# Patient Record
Sex: Female | Born: 1955 | Race: White | Hispanic: Yes | Marital: Married | Smoking: Former smoker
Health system: Southern US, Community
[De-identification: ages and names within clinical notes are randomized; demographics above are authoritative.]

## PROBLEM LIST (undated history)

## (undated) DIAGNOSIS — I10 Essential (primary) hypertension: Secondary | ICD-10-CM

## (undated) DIAGNOSIS — K219 Gastro-esophageal reflux disease without esophagitis: Secondary | ICD-10-CM

## (undated) HISTORY — DX: Essential (primary) hypertension: I10

## (undated) HISTORY — DX: Gastro-esophageal reflux disease without esophagitis: K21.9

---

## 2008-03-14 ENCOUNTER — Encounter: Admission: RE | Admit: 2008-03-14 | Discharge: 2008-03-14 | Payer: Self-pay | Admitting: Family Medicine

## 2008-05-02 ENCOUNTER — Encounter: Payer: Self-pay | Admitting: Physician Assistant

## 2008-05-10 ENCOUNTER — Encounter: Admission: RE | Admit: 2008-05-10 | Discharge: 2008-05-10 | Payer: Self-pay | Admitting: Family Medicine

## 2008-05-23 ENCOUNTER — Encounter: Payer: Self-pay | Admitting: Physician Assistant

## 2008-05-25 ENCOUNTER — Encounter: Admission: RE | Admit: 2008-05-25 | Discharge: 2008-05-25 | Payer: Self-pay | Admitting: Family Medicine

## 2008-06-02 ENCOUNTER — Encounter: Payer: Self-pay | Admitting: Physician Assistant

## 2008-07-18 ENCOUNTER — Encounter: Admission: RE | Admit: 2008-07-18 | Discharge: 2008-07-18 | Payer: Self-pay | Admitting: Pulmonary Disease

## 2008-07-26 ENCOUNTER — Encounter: Admission: RE | Admit: 2008-07-26 | Discharge: 2008-09-11 | Payer: Self-pay | Admitting: Sports Medicine

## 2008-09-05 ENCOUNTER — Observation Stay (HOSPITAL_COMMUNITY): Admission: EM | Admit: 2008-09-05 | Discharge: 2008-09-06 | Payer: Self-pay | Admitting: Emergency Medicine

## 2008-09-05 ENCOUNTER — Ambulatory Visit: Payer: Self-pay | Admitting: Cardiology

## 2008-09-06 ENCOUNTER — Encounter (INDEPENDENT_AMBULATORY_CARE_PROVIDER_SITE_OTHER): Payer: Self-pay | Admitting: Internal Medicine

## 2008-11-01 ENCOUNTER — Emergency Department (HOSPITAL_COMMUNITY): Admission: EM | Admit: 2008-11-01 | Discharge: 2008-11-01 | Payer: Self-pay | Admitting: Family Medicine

## 2009-01-26 ENCOUNTER — Emergency Department (HOSPITAL_COMMUNITY): Admission: EM | Admit: 2009-01-26 | Discharge: 2009-01-26 | Payer: Self-pay | Admitting: Emergency Medicine

## 2009-04-03 ENCOUNTER — Encounter: Payer: Self-pay | Admitting: Physician Assistant

## 2009-04-03 ENCOUNTER — Ambulatory Visit: Payer: Self-pay | Admitting: Physician Assistant

## 2009-04-03 DIAGNOSIS — M722 Plantar fascial fibromatosis: Secondary | ICD-10-CM

## 2009-04-03 DIAGNOSIS — M25559 Pain in unspecified hip: Secondary | ICD-10-CM | POA: Insufficient documentation

## 2009-04-03 DIAGNOSIS — M899 Disorder of bone, unspecified: Secondary | ICD-10-CM | POA: Insufficient documentation

## 2009-04-03 DIAGNOSIS — M1990 Unspecified osteoarthritis, unspecified site: Secondary | ICD-10-CM | POA: Insufficient documentation

## 2009-04-03 DIAGNOSIS — R82998 Other abnormal findings in urine: Secondary | ICD-10-CM

## 2009-04-03 DIAGNOSIS — E785 Hyperlipidemia, unspecified: Secondary | ICD-10-CM | POA: Insufficient documentation

## 2009-04-03 DIAGNOSIS — M949 Disorder of cartilage, unspecified: Secondary | ICD-10-CM

## 2009-04-03 LAB — CONVERTED CEMR LAB
Glucose, Urine, Semiquant: NEGATIVE
Ketones, urine, test strip: NEGATIVE
Urobilinogen, UA: 0.2

## 2009-04-04 ENCOUNTER — Encounter: Payer: Self-pay | Admitting: Physician Assistant

## 2009-04-24 ENCOUNTER — Telehealth (INDEPENDENT_AMBULATORY_CARE_PROVIDER_SITE_OTHER): Payer: Self-pay | Admitting: *Deleted

## 2009-04-24 ENCOUNTER — Encounter: Payer: Self-pay | Admitting: Physician Assistant

## 2009-05-01 IMAGING — MG MM DIGITAL DIAGNOSTIC LIMITED*R*
2 series · 2 of 2 positions shown · non-contrast
Comparison: 05/10/2008 screening mammogram.

CLINICAL DATA: 52-year-old with abnormal screening mammogram with
possible right breast mass.

[REDACTED] RIGHT  MAMMOGRAM   AND RIGHT BREAST
ULTRASOUND:

[R MLO]
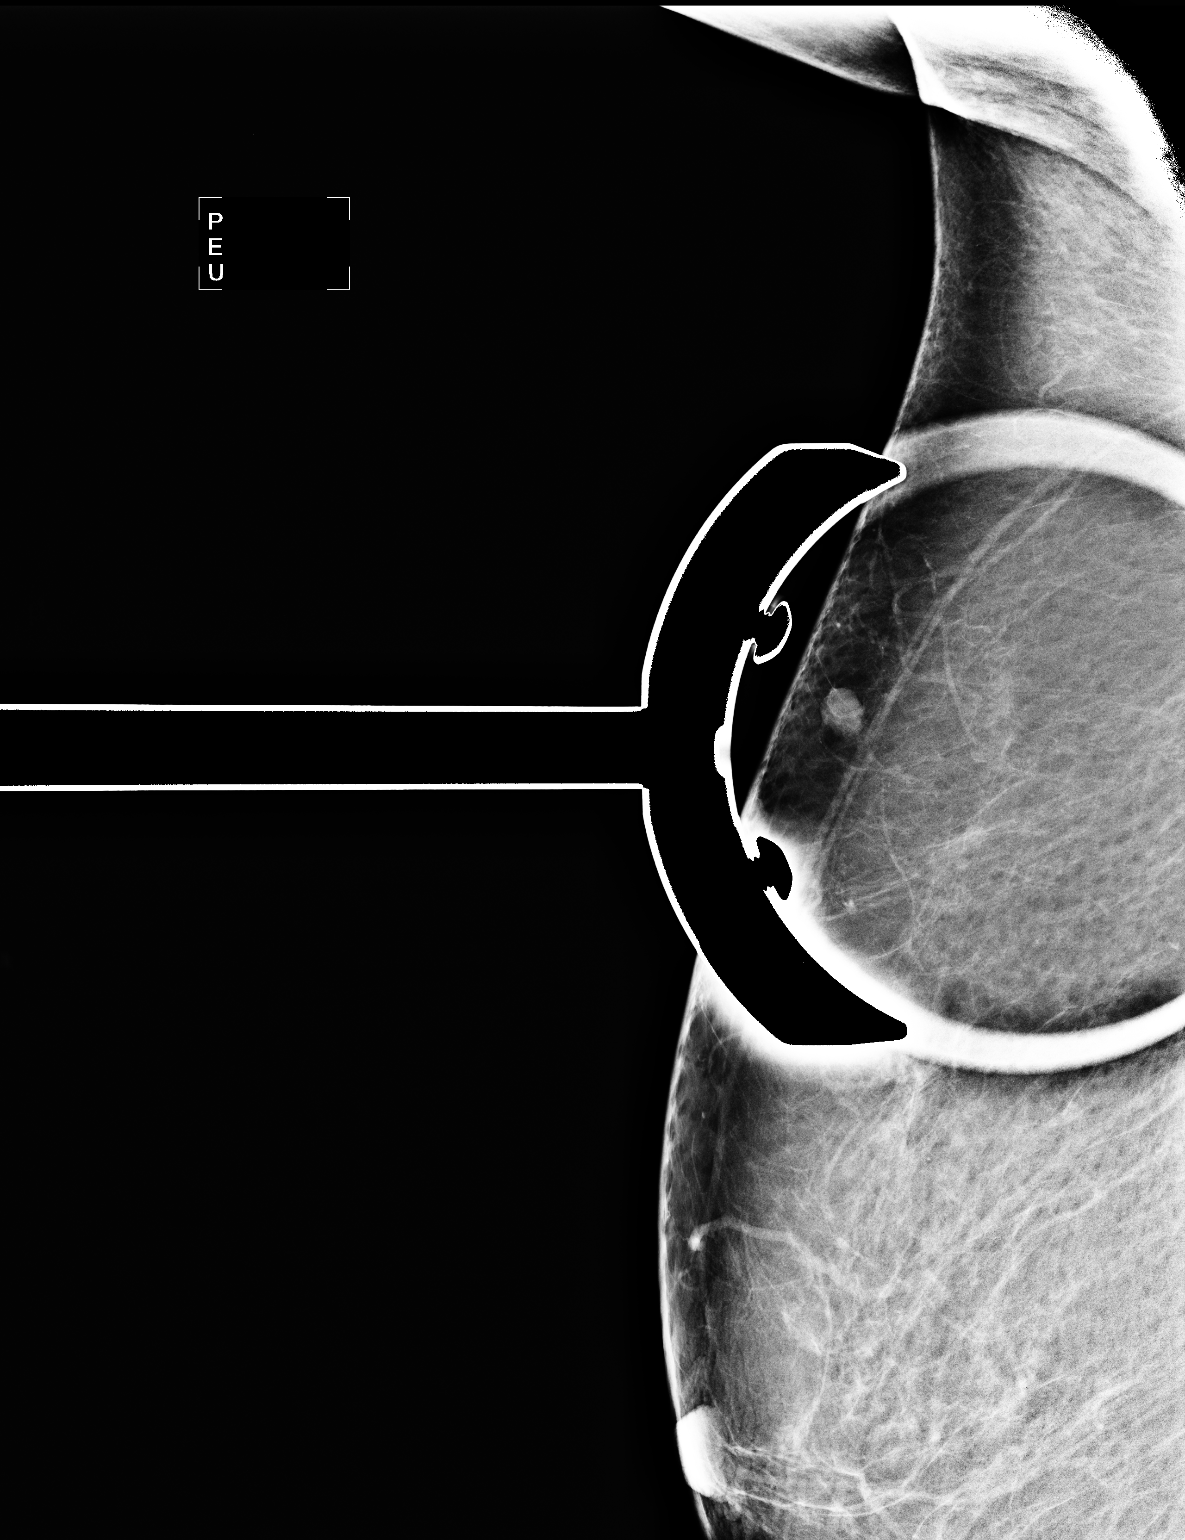

[R CC]
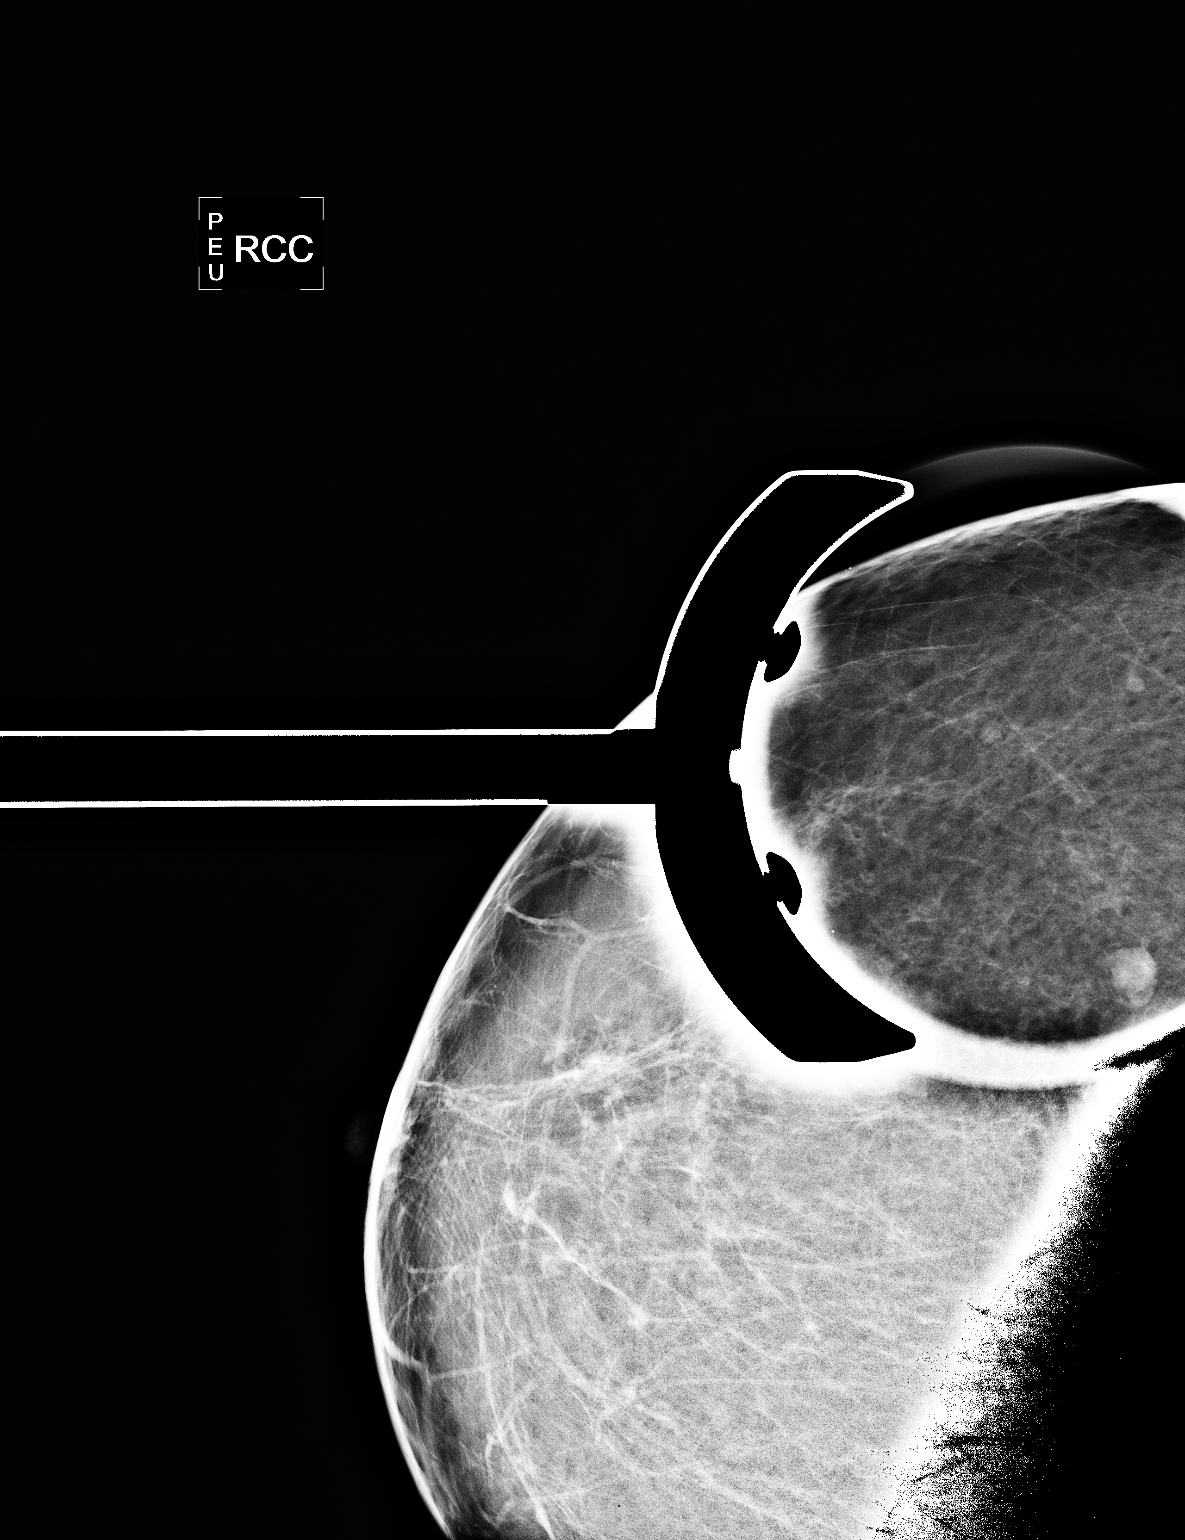

[2 of 2 positions shown; findings below may reference images not displayed]

FINDINGS: CC and MLO compression views of the upper and outer right
breast demonstrate a well circumscribed oval nodule with suggestion
of a peripheral fatty hilum.

On physical exam, no palpable abnormalities are identified within
the upper outer right breast.

Ultrasound is performed, showing a 5 x 4 x 6 mm well circumscribed
oval hypoechoic nodule in the 11 o'clock position of the right
breast 7 cm from the nipple with a peripheral fatty hilum
compatible with a benign lymph node.
IMPRESSION: Benign lymph node within the upper outer right breast corresponding
to screening mammogram abnormality.

These findings were discussed with the patient.  She was encouraged
to continue monthly self exams and to contact the [REDACTED] or
primary physician if any changes noted.

BI-RADS CATEGORY 2:  Benign finding(s).

Recommend bilateral screening mammogram in 1 year.

## 2009-05-30 ENCOUNTER — Ambulatory Visit: Payer: Self-pay | Admitting: Physician Assistant

## 2009-05-31 ENCOUNTER — Encounter: Payer: Self-pay | Admitting: Physician Assistant

## 2009-06-01 ENCOUNTER — Encounter: Admission: RE | Admit: 2009-06-01 | Discharge: 2009-06-01 | Payer: Self-pay | Admitting: Internal Medicine

## 2009-06-04 ENCOUNTER — Encounter: Payer: Self-pay | Admitting: Physician Assistant

## 2009-06-04 ENCOUNTER — Ambulatory Visit: Payer: Self-pay | Admitting: Physician Assistant

## 2009-06-04 DIAGNOSIS — R319 Hematuria, unspecified: Secondary | ICD-10-CM

## 2009-06-04 DIAGNOSIS — R079 Chest pain, unspecified: Secondary | ICD-10-CM

## 2009-06-04 DIAGNOSIS — R030 Elevated blood-pressure reading, without diagnosis of hypertension: Secondary | ICD-10-CM | POA: Insufficient documentation

## 2009-06-04 LAB — CONVERTED CEMR LAB
Bilirubin Urine: NEGATIVE
Glucose, Urine, Semiquant: NEGATIVE
Ketones, urine, test strip: NEGATIVE
Nitrite: NEGATIVE
Specific Gravity, Urine: 1.02
Urobilinogen, UA: 0.2
pH: 6

## 2009-06-05 ENCOUNTER — Encounter: Payer: Self-pay | Admitting: Physician Assistant

## 2009-06-05 LAB — CONVERTED CEMR LAB
Casts: NONE SEEN /lpf
Squamous Epithelial / LPF: NONE SEEN /lpf

## 2009-06-12 ENCOUNTER — Ambulatory Visit: Payer: Self-pay | Admitting: Physician Assistant

## 2009-06-13 DIAGNOSIS — E559 Vitamin D deficiency, unspecified: Secondary | ICD-10-CM | POA: Insufficient documentation

## 2009-06-13 LAB — CONVERTED CEMR LAB
ALT: 14 units/L (ref 0–35)
AST: 12 units/L (ref 0–37)
Albumin: 4.2 g/dL (ref 3.5–5.2)
Basophils Absolute: 0 10*3/uL (ref 0.0–0.1)
Basophils Relative: 0 % (ref 0–1)
Chloride: 104 meq/L (ref 96–112)
Creatinine, Ser: 0.67 mg/dL (ref 0.40–1.20)
Glucose, Bld: 128 mg/dL — ABNORMAL HIGH (ref 70–99)
HCT: 42.1 % (ref 36.0–46.0)
Hemoglobin: 13.3 g/dL (ref 12.0–15.0)
Lymphocytes Relative: 46 % (ref 12–46)
Lymphs Abs: 3 10*3/uL (ref 0.7–4.0)
Monocytes Absolute: 0.5 10*3/uL (ref 0.1–1.0)
Neutro Abs: 2.9 10*3/uL (ref 1.7–7.7)
Neutrophils Relative %: 45 % (ref 43–77)
Platelets: 265 10*3/uL (ref 150–400)
RBC: 4.63 M/uL (ref 3.87–5.11)
TSH: 4.212 microintl units/mL (ref 0.350–4.500)
Total Protein: 6.6 g/dL (ref 6.0–8.3)
Vit D, 25-Hydroxy: 22 ng/mL — ABNORMAL LOW (ref 30–89)

## 2009-06-14 ENCOUNTER — Encounter: Payer: Self-pay | Admitting: Physician Assistant

## 2009-06-18 ENCOUNTER — Ambulatory Visit: Payer: Self-pay | Admitting: Physician Assistant

## 2009-06-19 DIAGNOSIS — E739 Lactose intolerance, unspecified: Secondary | ICD-10-CM

## 2009-06-19 LAB — CONVERTED CEMR LAB
HDL: 54 mg/dL (ref >39)
VLDL: 53 mg/dL — ABNORMAL HIGH (ref 0–40)

## 2009-06-21 ENCOUNTER — Encounter: Payer: Self-pay | Admitting: Physician Assistant

## 2009-06-21 LAB — CONVERTED CEMR LAB
Cholesterol: 214 mg/dL — ABNORMAL HIGH (ref 0–200)
HDL: 58 mg/dL (ref >39)

## 2009-06-22 ENCOUNTER — Encounter: Payer: Self-pay | Admitting: Physician Assistant

## 2009-07-05 ENCOUNTER — Encounter: Payer: Self-pay | Admitting: Physician Assistant

## 2009-07-09 ENCOUNTER — Encounter: Payer: Self-pay | Admitting: Physician Assistant

## 2009-07-10 ENCOUNTER — Ambulatory Visit: Payer: Self-pay | Admitting: Physician Assistant

## 2009-07-10 LAB — CONVERTED CEMR LAB
Bilirubin Urine: NEGATIVE
Glucose, Urine, Semiquant: NEGATIVE
Nitrite: NEGATIVE
Pap Smear: NEGATIVE
Protein, U semiquant: NEGATIVE
Urobilinogen, UA: 0.2
WBC Urine, dipstick: NEGATIVE
pH: 6

## 2009-07-11 ENCOUNTER — Ambulatory Visit: Payer: Self-pay | Admitting: Internal Medicine

## 2009-07-11 ENCOUNTER — Encounter: Payer: Self-pay | Admitting: Physician Assistant

## 2009-07-12 ENCOUNTER — Ambulatory Visit (HOSPITAL_COMMUNITY): Admission: RE | Admit: 2009-07-12 | Discharge: 2009-07-12 | Payer: Self-pay | Admitting: Internal Medicine

## 2009-07-12 ENCOUNTER — Encounter: Payer: Self-pay | Admitting: Physician Assistant

## 2009-07-12 LAB — CONVERTED CEMR LAB
Bacteria, UA: NONE SEEN
Chlamydia, DNA Probe: NEGATIVE
RBC / HPF: NONE SEEN (ref <3)
WBC, UA: NONE SEEN cells/hpf (ref <3)

## 2009-07-24 ENCOUNTER — Ambulatory Visit: Payer: Self-pay | Admitting: Physician Assistant

## 2009-07-31 ENCOUNTER — Ambulatory Visit: Payer: Self-pay | Admitting: Physician Assistant

## 2009-08-11 ENCOUNTER — Encounter: Payer: Self-pay | Admitting: Physician Assistant

## 2009-08-16 ENCOUNTER — Encounter: Payer: Self-pay | Admitting: Physician Assistant

## 2009-08-19 ENCOUNTER — Encounter: Payer: Self-pay | Admitting: Physician Assistant

## 2009-09-07 ENCOUNTER — Telehealth: Payer: Self-pay | Admitting: Physician Assistant

## 2009-09-07 ENCOUNTER — Encounter: Payer: Self-pay | Admitting: Physician Assistant

## 2009-09-07 DIAGNOSIS — H40009 Preglaucoma, unspecified, unspecified eye: Secondary | ICD-10-CM | POA: Insufficient documentation

## 2009-09-17 ENCOUNTER — Emergency Department (HOSPITAL_COMMUNITY): Admission: EM | Admit: 2009-09-17 | Discharge: 2009-09-17 | Payer: Self-pay | Admitting: Emergency Medicine

## 2009-09-24 ENCOUNTER — Encounter: Payer: Self-pay | Admitting: Physician Assistant

## 2009-09-28 ENCOUNTER — Encounter: Payer: Self-pay | Admitting: Physician Assistant

## 2009-10-12 ENCOUNTER — Ambulatory Visit: Payer: Self-pay | Admitting: Physician Assistant

## 2009-10-12 ENCOUNTER — Telehealth: Payer: Self-pay | Admitting: Physician Assistant

## 2009-10-12 DIAGNOSIS — K047 Periapical abscess without sinus: Secondary | ICD-10-CM

## 2009-10-12 DIAGNOSIS — M25519 Pain in unspecified shoulder: Secondary | ICD-10-CM

## 2009-10-12 DIAGNOSIS — R300 Dysuria: Secondary | ICD-10-CM | POA: Insufficient documentation

## 2009-10-12 LAB — CONVERTED CEMR LAB
Bilirubin Urine: NEGATIVE
Glucose, Urine, Semiquant: NEGATIVE
Hgb A1c MFr Bld: 6.2 %

## 2009-10-13 ENCOUNTER — Encounter: Payer: Self-pay | Admitting: Physician Assistant

## 2009-10-15 LAB — CONVERTED CEMR LAB
ALT: 21 units/L (ref 0–35)
Albumin: 4.6 g/dL (ref 3.5–5.2)
Alkaline Phosphatase: 51 units/L (ref 39–117)
CO2: 27 meq/L (ref 19–32)
Cholesterol: 227 mg/dL — ABNORMAL HIGH (ref 0–200)
Creatinine, Ser: 0.64 mg/dL (ref 0.40–1.20)
Potassium: 5.1 meq/L (ref 3.5–5.3)
Sodium: 142 meq/L (ref 135–145)
Total Bilirubin: 0.3 mg/dL (ref 0.3–1.2)
Total Protein: 7.3 g/dL (ref 6.0–8.3)
Triglycerides: 185 mg/dL — ABNORMAL HIGH (ref <150)

## 2009-10-16 DIAGNOSIS — N3000 Acute cystitis without hematuria: Secondary | ICD-10-CM | POA: Insufficient documentation

## 2009-10-17 ENCOUNTER — Ambulatory Visit (HOSPITAL_COMMUNITY): Admission: RE | Admit: 2009-10-17 | Discharge: 2009-10-17 | Payer: Self-pay | Admitting: Physician Assistant

## 2009-10-23 ENCOUNTER — Telehealth: Payer: Self-pay | Admitting: Physician Assistant

## 2009-11-23 ENCOUNTER — Ambulatory Visit: Payer: Self-pay | Admitting: Physician Assistant

## 2009-11-23 DIAGNOSIS — G4700 Insomnia, unspecified: Secondary | ICD-10-CM

## 2009-11-23 LAB — CONVERTED CEMR LAB: Blood Glucose, Fingerstick: 98

## 2009-11-30 ENCOUNTER — Encounter: Payer: Self-pay | Admitting: Physician Assistant

## 2009-12-31 ENCOUNTER — Encounter: Payer: Self-pay | Admitting: Physician Assistant

## 2010-02-07 ENCOUNTER — Ambulatory Visit: Payer: Self-pay | Admitting: Physician Assistant

## 2010-02-10 LAB — CONVERTED CEMR LAB
ALT: 28 units/L (ref 0–35)
Alkaline Phosphatase: 45 units/L (ref 39–117)
Bilirubin, Direct: 0.1 mg/dL (ref 0.0–0.3)
HDL: 47 mg/dL (ref >39)
Indirect Bilirubin: 0.3 mg/dL (ref 0.0–0.9)
LDL Cholesterol: 103 mg/dL — ABNORMAL HIGH (ref 0–99)
Total Bilirubin: 0.4 mg/dL (ref 0.3–1.2)
Triglycerides: 182 mg/dL — ABNORMAL HIGH (ref <150)

## 2010-04-10 ENCOUNTER — Ambulatory Visit: Payer: Self-pay | Admitting: Nurse Practitioner

## 2010-06-03 ENCOUNTER — Ambulatory Visit (HOSPITAL_COMMUNITY)
Admission: RE | Admit: 2010-06-03 | Discharge: 2010-06-03 | Payer: Self-pay | Source: Home / Self Care | Attending: Internal Medicine | Admitting: Internal Medicine

## 2010-07-12 ENCOUNTER — Encounter (INDEPENDENT_AMBULATORY_CARE_PROVIDER_SITE_OTHER): Payer: Self-pay | Admitting: Internal Medicine

## 2010-07-12 ENCOUNTER — Ambulatory Visit
Admission: RE | Admit: 2010-07-12 | Discharge: 2010-07-12 | Payer: Self-pay | Source: Home / Self Care | Attending: Internal Medicine | Admitting: Internal Medicine

## 2010-07-12 DIAGNOSIS — F411 Generalized anxiety disorder: Secondary | ICD-10-CM | POA: Insufficient documentation

## 2010-07-12 DIAGNOSIS — R609 Edema, unspecified: Secondary | ICD-10-CM | POA: Insufficient documentation

## 2010-07-12 DIAGNOSIS — I10 Essential (primary) hypertension: Secondary | ICD-10-CM | POA: Insufficient documentation

## 2010-07-12 DIAGNOSIS — G609 Hereditary and idiopathic neuropathy, unspecified: Secondary | ICD-10-CM | POA: Insufficient documentation

## 2010-07-16 NOTE — Letter (Signed)
Summary: BMD TEST  BMD TEST   Imported By: Arta Bruce 09/17/2009 16:59:00  _____________________________________________________________________  External Attachment:    Type:   Image     Comment:   External Document

## 2010-07-16 NOTE — Progress Notes (Signed)
Summary: Office Visit/DEPRESSION SCREENING  Office Visit/DEPRESSION SCREENING   Imported By: Arta Bruce 07/20/2009 15:43:05  _____________________________________________________________________  External Attachment:    Type:   Image     Comment:   External Document

## 2010-07-16 NOTE — Miscellaneous (Signed)
Summary: [facility] [phone]  Clinical Lists Changes  Problems: Assessed UNSPECIFIED PREGLAUCOMA as comment only - seen by [facility] 6.2011 dx with ocular HTN yearly monitoring recommended  Observations: Added new observation of PAST MED HX: Osteoarthritis     a.  bilat. knees; bilat. shoulders; bilat. ankles; hand     b.  h/o shoulder injection in past Plantar fasciitis     a.  h/o injection to left heel Hyperlipidemia     a.  diet therapy ? h/o nephrolithiasis h/o frequent UTIs Osteopenia     a.  used to take Fosamax Vitamin D Insufficiency (level 22 05/2009) Post-Menopause GERD Echo 08/2008:  Normal LVF; trivial MR Glucose Intolerance (A1C 6.4 05/2009) glaucoma suspect   a.  seen by ophthal 6.2011 [facility]) (12/31/2009 13:20)       Past History:  Past Medical History: Osteoarthritis     a.  bilat. knees; bilat. shoulders; bilat. ankles; hand     b.  h/o shoulder injection in past Plantar fasciitis     a.  h/o injection to left heel Hyperlipidemia     a.  diet therapy ? h/o nephrolithiasis h/o frequent UTIs Osteopenia     a.  used to take Fosamax Vitamin D Insufficiency (level 22 05/2009) Post-Menopause GERD Echo 08/2008:  Normal LVF; trivial MR Glucose Intolerance (A1C 6.4 05/2009) glaucoma suspect   a.  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 Once daily 2)  Tramadol Hcl 50 Mg Tabs (Tramadol hcl) .... Take 1 tablet by mouth three times a day as needed for pain 3)  Calcium 600 Mg Tabs (Calcium) .... Take 1 tablet by mouth two times a day 4)  Omeprazole 20 Mg Cpdr (Omeprazole) .... Take 1 tablet by mouth once a day 5)  Vitamin D3 400 Unit Tabs (Cholecalciferol) .Marland Kitchen.. 1 by mouth two times a  day 6)  Pravastatin Sodium 40 Mg Tabs (Pravastatin sodium) .... Take 1 tab by mouth at bedtime for cholesterol 7)  Naproxen 500 Mg Tabs (Naproxen) .... Take 1 tablet by mouth two times a day with food as needed for pain 8)  Trazodone Hcl 50 Mg Tabs (Trazodone hcl) .... Take 1 tab by mouth at bedtime as needed for sleep

## 2010-07-16 NOTE — Letter (Signed)
Summary: REQUESTING RECORDS FORM DR.[facility]  REQUESTING RECORDS FORM DR.MANN   Imported By: Arta Bruce 09/24/2009 15:42:28  _____________________________________________________________________  External Attachment:    Type:   Image     Comment:   External Document

## 2010-07-16 NOTE — Assessment & Plan Note (Signed)
Summary: 1 MONTH FU FOR HIP PAIN//KT   Vital Signs:  Patient profile:   55 year old female Height:      61 inches Weight:      179 pounds Temp:     97.8 degrees F oral Pulse rate:   84 / minute Pulse rhythm:   regular Resp:     18 per minute BP sitting:   127 / 86  Vitals Entered By: [place] Shannon (November 23, 2009 9:30 AM) CC: one month f/u.... pt says her shoulder feels better with naproxen... pt says she cancelled PT since she has started back school but exercise at the [place]... CBG Result 98  Does patient need assistance? Functional Status Self care Ambulation Normal   Primary Care Provider:  Tereso Newcomer PA-C  CC:  one month f/u.... pt says her shoulder feels better with naproxen... pt says she cancelled PT since she has started back school but exercise at the rush....  History of Present Illness: 55 year old female returns for followup.  When I last saw her, she had multiple complaints.  I brought her back today to discuss her hip pain.  She was referred to physical therapy for her shoulder pain.  However, she canceled this appointment on her own.  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  She is been trying to seek some type of medical employment in the [place].  She just enrolled in surgical technician school.  She does not have time to go to physical therapy.  She has been taking the naproxen and this has helped her pain quite a bit.  She has been doing exercises on her own with good relief.  Her hip pain is also improved.  Her plantar fasciitis is also improved.  Of note, she's had trouble sleeping lately.  She's been studying late at night.  She denies watching TV in bed reading in bed.  She denies alcohol use or caffeine use before bedt.  She denies exercising before bed.  She denies depression.  She denies crying spells.  She denies suicidal ideations.  Problems Prior to Update: 1)  Insomnia  (ICD-780.52) 2)  Acute Cystitis  (ICD-595.0) 3)  Dysuria   (ICD-788.1) 4)  Shoulder Pain, Right  (ICD-719.41) 5)  Abscess, Tooth  (ICD-522.5) 6)  Unspecified Preglaucoma  (ICD-365.00) 7)  Routine Gynecological Examination  (ICD-V72.31) 8)  Glucose Intolerance  (ICD-271.3) 9)  Vitamin D Deficiency  (ICD-268.9) 10)  Hematuria Unspecified  (ICD-599.70) 11)  Increased Blood Pressure  (ICD-796.2) 12)  Chest Pain Unspecified  (ICD-786.50) 13)  Preventive Health Care  (ICD-V70.0) 14)  Unspecified Breast Screening  (ICD-V76.10) 15)  Urinalysis, Abnormal  (ICD-791.9) 16)  Hip Pain  (ICD-719.45) 17)  Fasciitis, Plantar  (ICD-728.71) 18)  Osteopenia  (ICD-733.90) 19)  Family History Diabetes 1st Degree Relative  (ICD-V18.0) 20)  Hyperlipidemia  (ICD-272.4) 21)  Osteoarthritis  (ICD-715.90)  Current Medications (verified): 1)  Vitamin E 200 Unit Caps (Vitamin E) .... Once Daily 2)  Tramadol Hcl 50 Mg Tabs (Tramadol Hcl) .... Take 1 Tablet By Mouth Three Times A Day As Needed For Pain 3)  Calcium 600 Mg Tabs (Calcium) .... Take 1 Tablet By Mouth Two Times A Day 4)  Omeprazole 20 Mg Cpdr (Omeprazole) .... Take 1 Tablet By Mouth Once A Day 5)  Vitamin D3 400 Unit Tabs (Cholecalciferol) .Marland Kitchen.. 1 By Mouth Two Times A Day 6)  Pravastatin Sodium 40 Mg Tabs (Pravastatin Sodium) .... Take 1 Tab By Mouth At  Bedtime For Cholesterol 7)  Naproxen 500 Mg Tabs (Naproxen) .... Take 1 Tablet By Mouth Two Times A Day With Food As Needed For Pain 8)  Ergocalciferol 50000 Unit Caps (Ergocalciferol) .... Take One By Mouth Once A Week For 8 Weeks.  Allergies (verified): No Known Drug Allergies  Past History:  Past Medical History: Last updated: 07/10/2009 Osteoarthritis     a.  bilat. knees; bilat. shoulders; bilat. ankles; hand     b.  h/o shoulder injection in past Plantar fasciitis     a.  h/o injection to left heel Hyperlipidemia     a.  diet therapy ? h/o nephrolithiasis h/o frequent UTIs Osteopenia     a.  used to take Fosamax Vitamin D Insufficiency  (level 22 05/2009) Post-Menopause GERD Echo 08/2008:  Normal LVF; trivial MR Glucose Intolerance (A1C 6.4 05/2009)  Physical Exam  General:  alert, well-developed, and well-nourished.   Head:  normocephalic and atraumatic.   Lungs:  normal breath sounds.   Heart:  normal rate and regular rhythm.   Neurologic:  alert & oriented X3 and cranial nerves II-XII intact.   Psych:  normally interactive and good eye contact.     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 has f/u next week  Problem # 3:  SHOULDER PAIN, RIGHT (ICD-719.41) improved on her own with NSAID and home exercise program  Her updated medication list for this problem includes:    Tramadol Hcl 50 Mg Tabs (Tramadol hcl) .Marland Kitchen... Take 1 tablet by mouth three times a day as needed for pain    Naproxen 500 Mg Tabs (Naproxen) .Marland Kitchen... Take 1 tablet by mouth two times a day with food as needed for pain  Problem # 4:  HIP PAIN (ICD-719.45) Assessment: Improved no further w/u  Her updated medication list for this problem includes:    Tramadol Hcl 50 Mg Tabs (Tramadol hcl) .Marland Kitchen... Take 1 tablet by mouth three times a day as needed for pain    Naproxen 500 Mg Tabs (Naproxen) .Marland Kitchen... Take 1 tablet by mouth two times a day with food as needed for pain  Problem # 5:  VITAMIN D DEFICIENCY (ICD-268.9) recheck level in Aug  Problem # 6:  HYPERLIPIDEMIA (ICD-272.4) recheck FLP in Aug  Her updated medication list for this problem includes:    Pravastatin Sodium 40 Mg Tabs (Pravastatin sodium) .Marland Kitchen... Take 1 tab by mouth at bedtime for cholesterol  Problem # 7:  DYSURIA (ICD-788.1) has appt with urology later this month  Problem # 8:  PREVENTIVE HEALTH CARE (ICD-V70.0) f/u CPP in Jan 2012  Complete Medication List: 1)  Vitamin E 200  Unit Caps (Vitamin e) .... Once daily 2)  Tramadol Hcl 50 Mg Tabs (Tramadol hcl) .... Take 1 tablet by mouth three times a day as needed for pain 3)  Calcium 600 Mg Tabs (Calcium) .... Take 1 tablet by mouth two times a day 4)  Omeprazole 20 Mg Cpdr (Omeprazole) .... Take 1 tablet by mouth once a day 5)  Vitamin D3 400 Unit Tabs (Cholecalciferol) .Marland Kitchen.. 1 by mouth two times a day 6)  Pravastatin Sodium 40 Mg Tabs (Pravastatin sodium) .... Take 1 tab by mouth at bedtime for cholesterol 7)  Naproxen 500 Mg Tabs (Naproxen) .... Take 1 tablet by mouth two times  a day with food as needed for pain 8)  Trazodone Hcl 50 Mg Tabs (Trazodone hcl) .... Take 1 tab by mouth at bedtime as needed for sleep  Patient Instructions: 1)  Schedule lab visit in 1st of August for Vitamin D level (268.9), FLP and LFTs (272.4). 2)  Try taking Benadryl (diphenhydramine) 25 mg at bedtime as needed for sleep.  You can get it over the counter. 3)  If this does not help, try the Trazodone. 4)  Arrange CPP in January 2012 with Scott.  Return sooner as needed. Prescriptions: TRAZODONE HCL 50 MG TABS (TRAZODONE HCL) Take 1 tab by mouth at bedtime as needed for sleep  #20 x 1   Entered and Authorized by:   Tereso Newcomer PA-C   Signed by:   Tereso Newcomer PA-C on 11/23/2009   Method used:   Print then Give to Patient   RxID:   [id number]

## 2010-07-16 NOTE — Letter (Signed)
Summary: PROGRESS NOTE DR.MANN  PROGRESS NOTE DR.MANN   Imported By: Arta Bruce 10/01/2009 10:23:19  _____________________________________________________________________  External Attachment:    Type:   Image     Comment:   External Document

## 2010-07-16 NOTE — Letter (Signed)
Summary: NUTRITIONIAST SUMMARY//SUSIE  NUTRITIONIAST SUMMARY//SUSIE   Imported By: Arta Bruce 10/09/2009 14:40:09  _____________________________________________________________________  External Attachment:    Type:   Image     Comment:   External Document

## 2010-07-16 NOTE — Letter (Signed)
Summary: [facility]   Imported By: Arta Bruce 01/01/2010 15:33:14  _____________________________________________________________________  External Attachment:    Type:   Image     Comment:   External Document

## 2010-07-16 NOTE — Miscellaneous (Signed)
Summary: records reviewed from prior PCP  Reviewed notes from Dr. Maryelizabeth Rowan (prior PCP). Patient had shoulder injection done once due to pain 2/2 rotator cuff tendonitis. Last pap was in 2009. Main hx includes arthritis and rotator cuff syndrome.  Clinical Lists Changes  Observations: Added new observation of PAST MED HX: Osteoarthritis     a.  bilat. knees; bilat. shoulders; bilat. ankles; hand     b.  h/o shoulder injection in past Plantar fasciitis     a.  h/o injection to left heel Hyperlipidemia     a.  diet therapy ? h/o nephrolithiasis h/o frequent UTIs Osteopenia     a.  used to take Fosamax Vitamin D Insufficiency (level 22 05/2009) Post-Menopause Mammogram 05/28/2009 per patient (not in [place]) GERD Echo 08/2008:  Normal LVF; trivial MR Glucose Intolerance (A1C 6.4 05/2009)  (07/09/2009 14:57) Added new observation of PAP SMEAR:  Specimen Adequacy: Satisfactory for evaluation.   Interpretation/Result:Negative for intraepithelial Lesion or Malignancy.   Interpretation/Result:Reactive Changes.  Interpretation/Result:Atrophy.    Performed at [facility] (Dr. Maryelizabeth Rowan)  (05/02/2008 14:59)      Pap Smear  Procedure date:  05/02/2008  Findings:       Specimen Adequacy: Satisfactory for evaluation.   Interpretation/Result:Negative for intraepithelial Lesion or Malignancy.   Interpretation/Result:Reactive Changes.  Interpretation/Result:Atrophy.    Performed at [facility] (Dr. Maryelizabeth Rowan)     Past History:  Past Medical History: Osteoarthritis     a.  bilat. knees; bilat. shoulders; bilat. ankles; hand     b.  h/o shoulder injection in past Plantar fasciitis     a.  h/o injection to left heel Hyperlipidemia     a.  diet therapy ? h/o nephrolithiasis h/o frequent UTIs Osteopenia     a.  used to take Fosamax Vitamin D Insufficiency (level 22 05/2009) Post-Menopause Mammogram 05/28/2009 per patient  (not in [place]) GERD Echo 08/2008:  Normal LVF; trivial MR Glucose Intolerance (A1C 6.4 05/2009)

## 2010-07-16 NOTE — Letter (Signed)
Summary: RECORDS RECEIVED FROM [facility]   RECORDS RECEIVED FROM [facility]   Imported By: Arta Bruce 09/03/2009 11:50:07  _____________________________________________________________________  External Attachment:    Type:   Image     Comment:   External Document

## 2010-07-16 NOTE — Letter (Signed)
Summary: TEST ORDER FORM//MAMMOGRAM//APPT DATE & TIME  TEST ORDER FORM//MAMMOGRAM//APPT DATE & TIME   Imported By: Arta Bruce 07/05/2009 15:06:38  _____________________________________________________________________  External Attachment:    Type:   Image     Comment:   External Document

## 2010-07-16 NOTE — Letter (Signed)
Summary: *HSN Results Follow up  [facility]  [address] [place], [place] [address]   Phone: [phone]  Fax: [phone]      07/12/2009   [place] Esparza [address] [place], [place]  [address]   Dear  Ms. Anna Esparza,                            ____S.Drinkard,FNP   ____D. Gore,FNP       ____B. McPherson,MD   ____V. Rankins,MD    ____E. Mulberry,MD    ____N. Daphine Deutscher, FNP  ____D. Reche Dixon, MD    ____K. Philipp Deputy, MD    __x__S. Alben Spittle, PA-C     This letter is to inform you that your recent test(s):  ___x____Pap Smear    _______Lab Test     _______X-ray    ___x____ is within acceptable limits  _______ requires a medication change  _______ requires a follow-up lab visit  _______ requires a follow-up visit with your provider   Comments:       _________________________________________________________ If you have any questions, please contact our office                     Sincerely,  Anna Newcomer PA-C [facility]

## 2010-07-16 NOTE — Miscellaneous (Signed)
Summary: Colo 2010. . . . Repeat 2015  Clinical Lists Changes  Observations: Added new observation of COLONRECACT: Repeat colonoscopy in 5 years.  (06/21/2008 16:11) Added new observation of COLONOSCOPY:  Results: Hemorrhoids.     Location:  [facility].   Results: Normal.  (06/21/2008 16:11)      Colonoscopy  Procedure date:  06/21/2008  Findings:       Results: Hemorrhoids.     Location:  [facility].   Results: Normal.   Comments:      Repeat colonoscopy in 5 years.

## 2010-07-16 NOTE — Letter (Signed)
Summary: *HSN Results Follow up  [facility]  [address] [place], [place] [address]   Phone: [phone]  Fax: [phone]      06/22/2009   [name] [address] [place], [place]  [address]   Dear  Ms. Anna Esparza,                            ____S.Drinkard,FNP   ____D. Gore,FNP       ____B. McPherson,MD   ____V. Rankins,MD    ____E. Mulberry,MD    ____N. Daphine Deutscher, FNP  ____D. Reche Dixon, MD    ____K. Philipp Deputy, MD    __x__S. Alben Spittle, PA-C     This letter is to inform you that your recent test(s):  _______Pap Smear    ___x____Lab Test     _______X-ray    ___x____ is within acceptable limits  _______ requires a medication change  _______ requires a follow-up lab visit  _______ requires a follow-up visit with your provider   Comments:  Your stool test was negative for blood.       _________________________________________________________ If you have any questions, please contact our office                     Sincerely,  Anna Newcomer PA-C [facility]

## 2010-07-16 NOTE — Progress Notes (Signed)
Summary: Office Visit//DEPRESSION SCREENING  Office Visit//DEPRESSION SCREENING   Imported By: Arta Bruce 09/05/2009 10:14:48  _____________________________________________________________________  External Attachment:    Type:   Image     Comment:   External Document

## 2010-07-16 NOTE — Assessment & Plan Note (Signed)
Summary: FLU SHOT////KT  Nurse Visit   Allergies: No Known Drug Allergies  Immunizations Administered:  Influenza Vaccine # 1:    Vaccine Type: Fluvax 3+    Site: right deltoid    Mfr: GlaxoSmithKline    Dose: 0.5 ml    Route: IM    Given by: Gaylyn Cheers RN    Exp. Date: 12/14/2010    Lot #: [id number]    VIS given: 01/08/10 version given April 10, 2010.  Flu Vaccine Consent Questions:    Do you have a history of severe allergic reactions to this vaccine? no    Any prior history of allergic reactions to egg and/or gelatin? no    Do you have a sensitivity to the preservative Thimersol? no    Do you have a past history of Guillan-Barre Syndrome? no    Do you currently have an acute febrile illness? no    Have you ever had a severe reaction to latex? no    Vaccine information given and explained to patient? yes    Are you currently pregnant? no  Orders Added: 1)  Flu Vaccine 50yrs + [90658] 2)  Admin 1st Vaccine [90471] 3)  Est. Patient Nurse visit [09003]

## 2010-07-16 NOTE — Miscellaneous (Signed)
Summary: Colo Normal 2005; repeat due 2015  Clinical Lists Changes  Observations: Added new observation of FAMILY HX: Family History Diabetes 1st degree relative - Mom colon cancer - uncle   (08/19/2009 16:06) Added new observation of COLONRECACT: Repeat colonoscopy in 5 years.  (06/21/2008 16:12) Added new observation of COLONOSCOPY:  Results: Normal. Results: Hemorrhoids.      Performed by Dr. Loreta Ave at [facility] (06/21/2008 16:12)        Colonoscopy  Procedure date:  06/21/2008  Findings:       Results: Normal. Results: Hemorrhoids.      Performed by Dr. Loreta Ave at [facility]  Comments:      Repeat colonoscopy in 5 years.    Family History: Family History Diabetes 1st degree relative - Mom colon cancer - uncle  Appended Document: Colo Normal 2005; repeat due 2015    Clinical Lists Changes  Observations: Added new observation of COLONNXTDUE: 06/2013 (08/19/2009 16:14)        Preventive Care Screening  Colonoscopy:    Next Due:  06/2013

## 2010-07-16 NOTE — Progress Notes (Signed)
  Phone Note Outgoing Call   Summary of Call: Need ophthalmology referral for glaucoma suspect on retasure. Initial call taken by: Brynda Rim,  September 07, 2009 4:19 PM

## 2010-07-16 NOTE — Progress Notes (Signed)
Summary: PT referral  Phone Note Outgoing Call   Summary of Call: Please refer to PT for right shoulder pain. Initial call taken by: Brynda Rim,  Oct 23, 2009 5:16 PM

## 2010-07-16 NOTE — Letter (Signed)
Summary: RETASURE  RETASURE   Imported By: Arta Bruce 11/19/2009 10:25:42  _____________________________________________________________________  External Attachment:    Type:   Image     Comment:   External Document

## 2010-07-16 NOTE — Letter (Signed)
Summary: NUTRITIONIST//SUSIE  NUTRITIONIST//SUSIE   Imported By: Arta Bruce 09/27/2009 15:13:37  _____________________________________________________________________  External Attachment:    Type:   Image     Comment:   External Document

## 2010-07-16 NOTE — Letter (Signed)
Summary: REQUESTING RECORDSS FROM DR.ELIZABETH DEWEY  REQUESTING RECORDSS FROM DR.ELIZABETH DEWEY   Imported By: Arta Bruce 08/13/2009 15:58:12  _____________________________________________________________________  External Attachment:    Type:   Image     Comment:   External Document

## 2010-07-16 NOTE — Letter (Signed)
Summary: COLO REPORT//DUE 2015  COLO REPORT//DUE 2015   Imported By: Arta Bruce 10/19/2009 14:44:00  _____________________________________________________________________  External Attachment:    Type:   Image     Comment:   External Document

## 2010-07-16 NOTE — Assessment & Plan Note (Signed)
Summary: CPP////KT   Vital Signs:  Patient profile:   55 year old female Height:      61 inches Weight:      180 pounds BMI:     34.13 Temp:     97.5 degrees F oral Pulse rate:   74 / minute Pulse rhythm:   regular Resp:     18 per minute BP sitting:   116 / 74  (left arm) Cuff size:   large  Vitals Entered By: [place] Shannon (July 10, 2009 9:16 AM) CC: cpp Is Patient Diabetic? No Pain Assessment Patient in pain? no       Does patient need assistance? Functional Status Self care Ambulation Normal   CC:  cpp.  History of Present Illness: Here for CPP.  Health maint: Last pap in 2009. No h/o abnormal pap. No recent bleeding.  No odor, discharge or itching. Mammo done in 05/2009 and was ok. No FHx of Breast or Ovarian cancer. Reports having colonoscopy done in 06/2008.  Says it was normal and would need repeat in 5 years. Says Dr. Loreta Ave did colonscopy. She takes calcium with Vit. D.  Level of VIt. D was 22 in last couple of months. Had DEXA scan in 2009 per her report.  Did take Fosamax at one time.  Was told to take for one year but only took for 5 mos.    High chol:  Started her on pravastatin for high lipids.  Her husband has Crestor and she is taking that instead.    Habits & Providers  Alcohol-Tobacco-Diet     Tobacco Status: quit  Exercise-Depression-Behavior     Have you felt down or hopeless? no     Have you felt little pleasure in things? no  Problems Prior to Update: 1)  Routine Gynecological Examination  (ICD-V72.31) 2)  Glucose Intolerance  (ICD-271.3) 3)  Vitamin D Deficiency  (ICD-268.9) 4)  Hematuria Unspecified  (ICD-599.70) 5)  Increased Blood Pressure  (ICD-796.2) 6)  Chest Pain Unspecified  (ICD-786.50) 7)  Preventive Health Care  (ICD-V70.0) 8)  Unspecified Breast Screening  (ICD-V76.10) 9)  Urinalysis, Abnormal  (ICD-791.9) 10)  Hip Pain  (ICD-719.45) 11)  Fasciitis, Plantar  (ICD-728.71) 12)  Osteopenia  (ICD-733.90) 13)  Family  History Diabetes 1st Degree Relative  (ICD-V18.0) 14)  Hyperlipidemia  (ICD-272.4) 15)  Osteoarthritis  (ICD-715.90)  Current Medications (verified): 1)  Vitamin E 200 Unit Caps (Vitamin E) .... Once Daily 2)  Diclofenac Sodium 75 Mg Tbec (Diclofenac Sodium) .... Once Daily 3)  Tramadol Hcl 50 Mg Tabs (Tramadol Hcl) .... Take 1 Tablet By Mouth Three Times A Day As Needed For Pain 4)  Calcium 600 Mg Tabs (Calcium) .... Take 1 Tablet By Mouth Two Times A Day 5)  Omeprazole 20 Mg Cpdr (Omeprazole) .... Take 1 Tablet By Mouth Once A Day 6)  Vitamin D3 400 Unit Tabs (Cholecalciferol) .Marland Kitchen.. 1 By Mouth Two Times A Day 7)  Pravastatin Sodium 20 Mg Tabs (Pravastatin Sodium) .... Take 1 Tab By Mouth At Bedtime  Allergies (verified): No Known Drug Allergies  Past History:  Past Surgical History: Last updated: 04/03/2009 Denies surgical history  Past Medical History: Osteoarthritis     a.  bilat. knees; bilat. shoulders; bilat. ankles; hand     b.  h/o shoulder injection in past Plantar fasciitis     a.  h/o injection to left heel Hyperlipidemia     a.  diet therapy ? h/o nephrolithiasis h/o frequent UTIs Osteopenia  a.  used to take Fosamax Vitamin D Insufficiency (level 22 05/2009) Post-Menopause GERD Echo 08/2008:  Normal LVF; trivial MR Glucose Intolerance (A1C 6.4 05/2009)  Family History: Reviewed history from 04/03/2009 and no changes required. Family History Diabetes 1st degree relative - Mom  Social History: Reviewed history from 04/03/2009 and no changes required. 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 CV:  Denies chest pain or discomfort and shortness of breath with exertion. Resp:  Denies cough. GI:  Complains of constipation; denies bloody stools, dark tarry stools, and  diarrhea. GU:  Denies hematuria. Psych:  Denies depression. Endo:  Denies cold intolerance.  Physical Exam  General:  alert, well-developed, and well-nourished.   Head:  normocephalic and atraumatic.   Eyes:  pupils equal, pupils round, pupils reactive to light, and no retinal abnormalitiies.   Ears:  R ear normal and L ear normal.   Nose:  no external deformity.   Mouth:  pharynx pink and moist, no erythema, and no exudates.   Neck:  supple, no thyromegaly, no carotid bruits, and no cervical lymphadenopathy.   Breasts:  skin/areolae normal, no masses, no abnormal thickening, no nipple discharge, no tenderness, and no adenopathy.   Lungs:  normal breath sounds, no crackles, and no wheezes.   Heart:  normal rate, regular rhythm, and no murmur.   Abdomen:  soft, non-tender, normal bowel sounds, and no hepatomegaly.   Rectal:  normal sphincter tone, no masses, and external hemorrhoid(s).   Genitalia:  normal introitus, no external lesions, no vaginal discharge, mucosa pink and moist, no vaginal or cervical lesions, no vaginal atrophy, no friaility or hemorrhage, normal uterus size and position, and no adnexal masses or tenderness.   Msk:  normal ROM.   Pulses:  DP/PT 2+ bilat  Extremities:  no edema  Neurologic:  alert & oriented X3, cranial nerves II-XII intact, strength normal in all extremities, and DTRs symmetrical and normal.   Skin:  turgor normal.   Psych:  normally interactive and good eye contact.     Impression & Recommendations:  Problem # 1:  PREVENTIVE HEALTH CARE (ICD-V70.0) PHQ9=0 has a h/o osteopenia and had taken fosamax repeat DEXA   Orders: Dexa scan (Dexa scan)  Problem # 2:  ROUTINE GYNECOLOGICAL EXAMINATION (ICD-V72.31)  Orders: KOH/ WET Mount [phone]) T- GC Chlamydia (60454) T-Pap Smear, Thin Prep (09811)  Problem # 3:  GLUCOSE INTOLERANCE (ICD-271.3)  Hgb A1C was 6.4 recently refer to Susie Piper  Orders: Diabetic Clinic Referral  (Diabetic) T-Urine Microalbumin w/creat. ratio [phone])  Problem # 4:  HYPERLIPIDEMIA (ICD-272.4) pravastatin Rx'd but patient taking Crestor that her husband stopped taking needs repeat L/L in 3 mos.  Her updated medication list for this problem includes:    Pravastatin Sodium 20 Mg Tabs (Pravastatin sodium) .Marland Kitchen... Take 1 tab by mouth at bedtime  Problem # 5:  HEMATURIA UNSPECIFIED (ICD-599.70)  send for micro and c+s tx for UTI in Dec.  Orders: T-Culture, Urine ([id number]) T- * Misc. Laboratory test [phone])  Complete Medication List: 1)  Vitamin E 200 Unit Caps (Vitamin e) .... Once daily 2)  Diclofenac Sodium 75 Mg Tbec (Diclofenac sodium) .... Once daily 3)  Tramadol Hcl 50 Mg Tabs (Tramadol hcl) .... Take 1 tablet by mouth three times a day as needed for pain 4)  Calcium  600 Mg Tabs (Calcium) .... Take 1 tablet by mouth two times a day 5)  Omeprazole 20 Mg Cpdr (Omeprazole) .... Take 1 tablet by mouth once a day 6)  Vitamin D3 400 Unit Tabs (Cholecalciferol) .Marland Kitchen.. 1 by mouth two times a day 7)  Pravastatin Sodium 20 Mg Tabs (Pravastatin sodium) .... Take 1 tab by mouth at bedtime  Patient Instructions: 1)  Call us today and let us know what dose of Crestor you are on. 2)  Sign form to get records released from Dr. Loreta Ave.  Need colonoscopy report from 2010. 3)  Schedule follow up lab visit in April 2011 for CMET, Lipids.  Come fasting (nothing to eat or drink after midnight except water).  Dx 272.4. 4)  Schedule [facility] at [facility]. 5)  Please schedule a follow-up appointment in 3 months with Reuben Knoblock for lipids and sugar.   Laboratory Results   Urine Tests  Date/Time Received: July 10, 2009 9:30 AM   Routine Urinalysis   Glucose: negative   (Normal Range: Negative) Bilirubin: negative   (Normal Range: Negative) Ketone: negative   (Normal Range: Negative) Spec. Gravity: 1.015   (Normal Range: 1.003-1.035) Blood: trace-intact   (Normal Range:  Negative) pH: 6.0   (Normal Range: 5.0-8.0) Protein: negative   (Normal Range: Negative) Urobilinogen: 0.2   (Normal Range: 0-1) Nitrite: negative   (Normal Range: Negative) Leukocyte Esterace: negative   (Normal Range: Negative)      Wet Mount Source: vaginal WBC/hpf: 1-5 Bacteria/hpf: 1+ Clue cells/hpf: none  Negative whiff Yeast/hpf: none Wet [place] KOH: Negative Trichomonas/hpf: none  Stool - Occult Blood Hemmoccult #1: negative Date: 07/10/2009    Appended Document: CPP////KT Patient: Anna Esparza Note: All result statuses are Final unless otherwise noted.  Tests: (1) Chlamydia and GC Probe Amp, Genital (5990)  Chlamydia Probe Amp, Genital                             NEGATIVE                    NEGATIVE           Testing performed using the BD Probetec ET Chlamydia     trachomatis and Neisseria gonorrhea amplified DNA assay.  GC Probe Amp, Genital                             NEGATIVE                    NEGATIVE           Testing performed using the BD Probetec ET Chlamydia     trachomatis and Neisseria gonorrhea amplified DNA assay.  Tests: (2) Microalbumin ([address])   Microalbumin              0.59 mg/dL                  0.00-1.89  Tests: (3) Urine Microscopic (65[address])  Squamous Epithelial/ HPF                             NONE SEEN                   RARE   Crystals                  NONE SEEN  NEG   Casts                     NONE SEEN                   NEG   WBC                       NONE SEEN WBC/hpf           <3   RBC                       NONE SEEN RBC/hpf           <3   Bacteria/ HPF             NONE SEEN                   RARE  Note: An exclamation mark (!) indicates a result that was not dispersed into the flowsheet. Document Creation Date: 07/11/2009 11:35 AM _______________________________________________________________________  (1) Order result status: Final Collection or observation date-time: 07/10/2009 21:31 Requested  date-time: 07/10/2009 10:35 Receipt date-time: 07/10/2009 21:31 Reported date-time: 07/11/2009 11:35 Referring Physician:   Ordering Physician:  Alben Spittle [id number]) Specimen Source:  Source: Lajean Silvius Order Number: [id number] Lab site: [facility]     [address]     [place]  [place]  [address]  (2) Order result status: Final Collection or observation date-time: 07/10/2009 21:31 Requested date-time: 07/10/2009 10:35 Receipt date-time: 07/10/2009 21:31 Reported date-time: 07/11/2009 11:35 Referring Physician:   Ordering Physician:  Alben Spittle [id number]) Specimen Source:  Source: Lajean Silvius Order Number: [id number] Lab site: [facility]     [address]     [place]  [place]  [address]  (3) Order result status: Final Collection or observation date-time: 07/10/2009 21:31 Requested date-time: 07/10/2009 10:35 Receipt date-time: 07/10/2009 21:31 Reported date-time: 07/11/2009 11:35 Referring Physician:   Ordering Physician:  Alben Spittle [id number]) Specimen Source:  Source: Lajean Silvius Order Number: [id number] Lab site: [facility]     [address], [address]     [place]  [place]  [address]   -----------------  The following non-numeric lab results were dispersed to the flowsheet even though numeric results were expected:    Squamous Epithelial/ HPF, NONE SEEN   WBC, NONE SEEN   Signed by Tereso Newcomer PA-C on 07/12/2009 at 10:15 PM  ________________________________________________________________________ no red cells + 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

## 2010-07-16 NOTE — Letter (Signed)
Summary: referral/diabetic clinic//appt date & time  referral/diabetic clinic//appt date & time   Imported By: Arta Bruce 08/30/2009 14:18:23  _____________________________________________________________________  External Attachment:    Type:   Image     Comment:   External Document

## 2010-07-16 NOTE — Miscellaneous (Signed)
Summary: Retasure 06/2009  Clinical Lists Changes  Problems: Added new problem of UNSPECIFIED PREGLAUCOMA (ICD-365.00) Assessed UNSPECIFIED PREGLAUCOMA as comment only -  Orders: Ophthalmology Referral (Ophthalmology)  Orders: Added new Referral order of Ophthalmology Referral (Ophthalmology) - Signed Observations: Added new observation of DIAB EYE EX: Ret[facility] (glaucoma suspect) (07/16/2009 16:18)       Impression & Recommendations:  Problem # 1:  UNSPECIFIED PREGLAUCOMA (ICD-365.00)  Orders: Ophthalmology Referral (Ophthalmology)  Complete Medication List: 1)  Vitamin E 200 Unit Caps (Vitamin e) .... Once daily 2)  Diclofenac Sodium 75 Mg Tbec (Diclofenac sodium) .... Once daily 3)  Tramadol Hcl 50 Mg Tabs (Tramadol hcl) .... Take 1 tablet by mouth three times a day as needed for pain 4)  Calcium 600 Mg Tabs (Calcium) .... Take 1 tablet by mouth two times a day 5)  Omeprazole 20 Mg Cpdr (Omeprazole) .... Take 1 tablet by mouth once a day 6)  Vitamin D3 400 Unit Tabs (Cholecalciferol) .Marland Kitchen.. 1 by mouth two times a day 7)  Pravastatin Sodium 20 Mg Tabs (Pravastatin sodium) .... Take 1 tab by mouth at bedtime

## 2010-07-16 NOTE — Progress Notes (Signed)
Summary: urology referral  Phone Note Outgoing Call   Summary of Call: Needs referral to Urology for possible interstitial cystitis.  Initial call taken by: Tereso Newcomer PA-C,  October 12, 2009 10:16 AM

## 2010-07-16 NOTE — Op Note (Signed)
Summary: Operative Report  Operative Report   Imported By: Arta Bruce 10/01/2009 10:20:52  _____________________________________________________________________  External Attachment:    Type:   Image     Comment:   External Document

## 2010-07-16 NOTE — Letter (Signed)
Summary: Handout Printed  Printed Handout:  - Insomnia 

## 2010-07-16 NOTE — Progress Notes (Signed)
Summary: Dental Referral  Phone Note Outgoing Call   Summary of Call: Needs dental referral.  Went to ED 09/17/2009 and put on penicillin and hydrocodone. Initial call taken by: Brynda Rim,  October 12, 2009 9:47 AM

## 2010-07-16 NOTE — Letter (Signed)
Summary: Lipid Letter  [facility]  [address] [place], [place] [address]   Phone: [phone]  Fax: [phone]    06/21/2009  Tollie Pizza Noud [address] [place], [place]  [address]  Dear Anna Esparza:  We have carefully reviewed your last lipid profile from 06/18/2009 and the results are noted below with a summary of recommendations for lipid management.    Cholesterol:       214     Goal: < 200   HDL "good" Cholesterol:   58     Goal: > 50   LDL "bad" Cholesterol:   121     Goal: < 100   Triglycerides:       177     Goal: < 150    TLC Diet (Therapeutic Lifestyle Change): Saturated Fats & Transfatty acids should be kept < 7% of total calories ***Reduce Saturated Fats Polyunstaurated Fat can be up to 10% of total calories Monounsaturated Fat Fat can be up to 20% of total calories Total Fat should be no greater than 25-35% of total calories Carbohydrates should be 50-60% of total calories Protein should be approximately 15% of total calories Fiber should be at least 20-30 grams a day ***Increased fiber may help lower LDL Total Cholesterol should be < 200mg /day Consider adding plant stanol/sterols to diet (example: Benacol spread) ***A higher intake of unsaturated fat may reduce Triglycerides and Increase HDL      Adjunctive Measures (may lower LIPIDS and reduce risk of Heart Attack) include: Aerobic Exercise (20-30 minutes 3-4 times a week) Limit Alcohol Consumption Weight Reduction Aspirin 75-81 mg a day by mouth (if not allergic or contraindicated) Dietary Fiber 20-30 grams a day by mouth      Current Medications: 1)    Vitamin E 200 Unit Caps (Vitamin e) .... Once daily 2)    Diclofenac Sodium 75 Mg Tbec (Diclofenac sodium) .... Once daily 3)    Tramadol Hcl 50 Mg Tabs (Tramadol hcl) .... Take 1 tablet by mouth three times a day as needed for pain 4)    Calcium 600 Mg Tabs (Calcium) .... Take 1 tablet by mouth two times a day 5)    Omeprazole 20 Mg  Cpdr (Omeprazole) .... Take 1 tablet by mouth once a day 6)    Vitamin D3 400 Unit Tabs (Cholecalciferol) .Marland Kitchen.. 1 by mouth two times a day   I would like to put you on medicine for your cholesterol to meet your goals as noted above.  Please contact our office so we can call in your prescription to your pharmacy.   Sincerely,    [facility] Tereso Newcomer PA-C

## 2010-07-16 NOTE — Assessment & Plan Note (Signed)
Summary: 3 MONTH FU FOR LIPIDS AND SUGAR//KT   Vital Signs:  Patient profile:   55 year old female Height:      61 inches Weight:      180 pounds BMI:     34.13 Temp:     97.7 degrees F oral Pulse rate:   73 / minute Pulse rhythm:   regular Resp:     18 per minute BP sitting:   129 / 82  (left arm) Cuff size:   large  Vitals Entered By: Armenia Shannon (October 12, 2009 9:27 AM) Is Patient Diabetic? Yes Pain Assessment Patient in pain? no      CBG Result 92  Does patient need assistance? Functional Status Self care Ambulation Normal   Primary Care Provider:  Tereso Newcomer PA-C   History of Present Illness: Here for f/u. She has seen the dietician.  Sugar and A1C look good today. Taking Pravastatin and is due for labs today. Did go to ED recently with dental pain.  Placed on antibx and hydrocodone. She also notes several other complaints today.  Advised her we have to focus on one or 2.  Right Shoulder pain:  Has noted for years.  Says she had an injection with prior PCP.  Takes ibuprofen and tylenol as needed.  No h/o xray per her report.  Has full ROM.  Has pain with int and ext rotation.  Dysuria:  Says she has had since she was very young.  Recent u/a's here have been negative.  No specific change in symptoms.  Denies heavy use of caffeine or spicy foods.    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) 5)  Routine Gynecological Examination  (ICD-V72.31) 6)  Glucose Intolerance  (ICD-271.3) 7)  Vitamin D Deficiency  (ICD-268.9) 8)  Hematuria Unspecified  (ICD-599.70) 9)  Increased Blood Pressure  (ICD-796.2) 10)  Chest Pain Unspecified  (ICD-786.50) 11)  Preventive Health Care  (ICD-V70.0) 12)  Unspecified Breast Screening  (ICD-V76.10) 13)  Urinalysis, Abnormal  (ICD-791.9) 14)  Hip Pain  (ICD-719.45) 15)  Fasciitis, Plantar  (ICD-728.71) 16)   Osteopenia  (ICD-733.90) 17)  Family History Diabetes 1st Degree Relative  (ICD-V18.0) 18)  Hyperlipidemia  (ICD-272.4) 19)  Osteoarthritis  (ICD-715.90)  Current Medications (verified): 1)  Vitamin E 200 Unit Caps (Vitamin E) .... Once Daily 2)  Diclofenac Sodium 75 Mg Tbec (Diclofenac Sodium) .... Once Daily 3)  Tramadol Hcl 50 Mg Tabs (Tramadol Hcl) .... Take 1 Tablet By Mouth Three Times A Day As Needed For Pain 4)  Calcium 600 Mg Tabs (Calcium) .... Take 1 Tablet By Mouth Two Times A Day 5)  Omeprazole 20 Mg Cpdr (Omeprazole) .... Take 1 Tablet By Mouth Once A Day 6)  Vitamin D3 400 Unit Tabs (Cholecalciferol) .Marland Kitchen.. 1 By Mouth Two Times A Day 7)  Pravastatin Sodium 20 Mg Tabs (Pravastatin Sodium) .... Take 1 Tab By Mouth At Bedtime  Allergies (verified): No Known Drug Allergies  Past History:  Past Medical History: Last updated: 07/10/2009 Osteoarthritis     a.  bilat. knees; bilat. shoulders; bilat. ankles; hand     b.  h/o shoulder injection in past Plantar fasciitis     a.  h/o injection to left heel Hyperlipidemia     a.  diet therapy ? h/o nephrolithiasis h/o frequent UTIs Osteopenia     a.  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   Head:  normocephalic and atraumatic.   Neck:  supple.   Lungs:  normal breath sounds.   Heart:  normal rate and regular rhythm.   Abdomen:  soft.   Msk:  right shoulder: empty can test with pain full ROM pain with int and ext rotation no pain over  biceps tendon + crepitus with passive ROM  Neurologic:  alert & oriented X3 and cranial nerves II-XII intact.   Psych:  normally interactive.    Diabetes Management Exam:    Foot Exam (with socks and/or shoes not present):       Sensory-Monofilament:          Left foot: normal          Right foot: normal   Impression & Recommendations:  Problem # 1:  ABSCESS, TOOTH (ICD-522.5)  patient went to ED at [facility] on 09/17/2009 and treated with PCN and Hydrocodone Will set up referral to dental clinic  Orders: Dental Referral (Dentist)  Problem # 2:  HYPERLIPIDEMIA (ICD-272.4)  check labs today  Her updated medication list for this problem includes:    Pravastatin Sodium 20 Mg Tabs (Pravastatin sodium) .Marland Kitchen... 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 She has not heard back from them yet. She has been told that it may take about 2 months.  Problem # 6:  SHOULDER PAIN, RIGHT (ICD-719.41)  diclofenac bothers her stomach d/c diclofenac and change to naproxen get xrays consider referral to PT  The following medications were removed from the medication list:    Diclofenac Sodium 75 Mg Tbec (Diclofenac sodium) ..... Once daily Her updated medication list for this problem includes:    Tramadol Hcl 50 Mg Tabs (Tramadol hcl) .Marland Kitchen... Take 1 tablet by mouth three times a day as needed for pain    Naproxen 500 Mg Tabs (Naproxen) .Marland Kitchen... Take 1 tablet by mouth two times a day with food as needed for pain  Orders: Diagnostic X-Ray/Fluoroscopy (Diagnostic  X-Ray/Flu)  Problem # 7:  DYSURIA (ICD-788.1)  has had for years recent u/a's have been negative does not seem to indulge in irritative substances drinks plenty of water ? interstitial cystitis patient would like to go to urology refer to [facility]  Orders: T-Urinalysis ([id number]) T-Culture, Urine ([id number]) Urology Referral (Urology)  Complete Medication List: 1)  Vitamin E 200 Unit Caps (Vitamin e) .... Once daily 2)  Tramadol Hcl 50 Mg Tabs (Tramadol hcl) .... Take 1 tablet by mouth three times a day as needed for pain 3)  Calcium 600 Mg Tabs (Calcium) .... Take 1 tablet by mouth two times a day 4)  Omeprazole 20 Mg Cpdr (Omeprazole) .... Take  1 tablet by mouth once a day 5)  Vitamin D3 400 Unit Tabs (Cholecalciferol) .Marland Kitchen.. 1 by mouth two times a day 6)  Pravastatin Sodium 20 Mg Tabs (Pravastatin sodium) .... Take 1 tab by mouth at bedtime 7)  Naproxen 500 Mg Tabs (Naproxen) .... Take 1 tablet by mouth two times a day with food as needed for pain  Patient Instructions: 1)  Return in one month for your hip pain with Scott. 2)  Take tylenol on days that you are going to be active with your right arm OR just take tylenol two times a day every day to prevent pain. 3)  You can take Tylenol 1000mg .  DO not take more than 1000 mg of Tylenol every 6 hours. 4)  You can use the Naproxen with food two times a day as needed. Prescriptions: NAPROXEN 500 MG TABS (NAPROXEN) Take 1 tablet by mouth two times a day with food as needed for pain  #60 x 3   Entered and Authorized by:   Tereso Newcomer PA-C   Signed by:   Tereso Newcomer PA-C on 10/12/2009   Method used:   Print then Give to Patient   RxID:   [id number]    Last LDL:                                                 121 (06/18/2009 9:27:00 PM)        Diabetic Foot Exam Last Podiatry Exam Date: 10/12/2009  Foot Inspection Is there a history of a foot ulcer?              No Is there a foot ulcer now?              No Are the  shoes appropriate in style and fit?          Yes Is there swelling or an abnormal foot shape?          Yes Are the toenails long?                No Are the toenails thick?                No Are the toenails ingrown?              No Is there heavy callous build-up?              No Is there a claw toe deformity?                          No Is there elevated skin temperature?            No Is there limited ankle dorsiflexion?            No Is there foot or ankle muscle weakness?            No Do you have pain in calf while walking?           No      Diabetic Foot Care Education :Patient educated on appropriate care of diabetic feet.  Comments: pt says she does have some burning in her feet and ankles... Armenia Shannon  October 12, 2009 9:36 AM    10-g (5.07) Semmes-Weinstein Monofilament Test Performed by: Armenia Shannon  Right Foot          Left Foot Visual Inspection               Test Control      normal         normal Site 1         normal         normal Site 2         normal         normal Site 3         normal         normal Site 4         normal         normal Site 5         normal         normal Site 6         normal         normal Site 7         normal         normal Site 8         normal         normal Site 9         normal         normal Site 10         normal         normal  Impression      normal         normal   Laboratory Results   Urine Tests    Routine Urinalysis   Glucose: negative   (Normal Range: Negative) Bilirubin: negative   (Normal Range: Negative) Ketone: negative   (Normal Range: Negative) Spec. Gravity: >=1.030   (Normal Range: 1.003-1.035) Blood: trace-lysed   (Normal Range: Negative) pH: 5.0   (Normal Range: 5.0-8.0) Protein: negative   (Normal Range: Negative) Urobilinogen: 0.2   (Normal Range: 0-1) Nitrite: negative   (Normal Range: Negative) Leukocyte Esterace: trace   (Normal Range: Negative)     Blood Tests     HGBA1C:  6.2%   (Normal Range: Non-Diabetic - 3-6%   Control Diabetic - 6-8%) CBG Random:: 92mg /dL

## 2010-07-27 ENCOUNTER — Telehealth (INDEPENDENT_AMBULATORY_CARE_PROVIDER_SITE_OTHER): Payer: Self-pay | Admitting: Internal Medicine

## 2010-07-27 ENCOUNTER — Encounter (INDEPENDENT_AMBULATORY_CARE_PROVIDER_SITE_OTHER): Payer: Self-pay | Admitting: Internal Medicine

## 2010-08-01 NOTE — Assessment & Plan Note (Signed)
Summary: FEET PAIN///KT   Vital Signs:  Patient profile:   55 year old female Menstrual status:  postmenopausal Weight:      186.13 pounds BMI:     35.30 Temp:     97.4 degrees F oral Pulse rate:   90 / minute Pulse rhythm:   regular Resp:     22 per minute BP sitting:   138 / 98  (left arm) Cuff size:   regular  Vitals Entered By: Hale Drone CMA (July 12, 2010 4:12 PM) CC: Complaining of bilateral feet problems. States she feels a burning senstation. Cant' wear closed shoes. Walks bear foot but cant stand the pain. Right foot heel and right knee are hurting her. Taking Naproxen and Ibuprofen but not helping. Also having BP concerns. Is Patient Diabetic? No Pain Assessment Patient in pain? yes     Location: feet Intensity: 8 Type: Tender/stabbing Onset of pain  Constant  Does patient need assistance? Functional Status Self care Ambulation Normal     Menstrual Status postmenopausal Last PAP Result NEGATIVE FOR INTRAEPITHELIAL LESIONS OR MALIGNANCY.   Primary Care Provider:  Tereso Newcomer PA-C  CC:  Complaining of bilateral feet problems. States she feels a burning senstation. Cant' wear closed shoes. Walks bear foot but cant stand the pain. Right foot heel and right knee are hurting her. Taking Naproxen and Ibuprofen but not helping. Also having BP concerns..  History of Present Illness: 1.  Continuous burning pain of bilateral  feet for 3 years.  Only involving the plantar surface.  Pt. with glucose intolerance--highest A1C in 05-2009 of 6.4%.  Thyroid testing and Cmet have been fine.  She is not aware of ever having a B12 checked.  Has tried gabapentin--helped.  Used 300 mg once to twice daily.  Stopped using in morning as made her sleepy.  For some reason, was unable to get repeat prescriptions.  She cannot recall if any work up for the cause was done.    2.  Plantar fasciitis:  Chronic pain inleft heel--diagnosed 2 years ago.  Has had heel injections in the left heel.   Has heel cups for both feet.  Has had PT at physical therapy and did not help--only injections helped.  This was all for the left foot.  Her right heel pain started 4 months ago.    3.  Elevated BP:  Having headache and nausea.  Back at school and more anxious because of tests, etc.  Using Oxazepam when gets anxious for tests, etc--has been getting from her sister.    Does get edema of legs at times.  Current Medications (verified): 1)  Vitamin E 200 Unit Caps (Vitamin E) .... Once Daily 2)  Tramadol Hcl 50 Mg Tabs (Tramadol Hcl) .... Take 1 Tablet By Mouth Three Times A Day As Needed For Pain 3)  Calcium 600 Mg Tabs (Calcium) .... Take 1 Tablet By Mouth Two Times A Day 4)  Omeprazole 20 Mg Cpdr (Omeprazole) .... Take 1 Tablet By Mouth Once A Day 5)  Vitamin D 1000 Unit Tabs (Cholecalciferol) .... Take 1 Tablet By Mouth Once A Day 6)  Pravastatin Sodium 40 Mg Tabs (Pravastatin Sodium) .... Take 1 Tab By Mouth At Bedtime For Cholesterol 7)  Naproxen 500 Mg Tabs (Naproxen) .... Take 1 Tablet By Mouth Two Times A Day With Food As Needed For Pain 8)  Trazodone Hcl 50 Mg Tabs (Trazodone Hcl) .... Take 1 Tab By Mouth At Bedtime As Needed For Sleep 9)  Fish Oil 1000 Mg Caps (Omega-3 Fatty Acids) .... 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 Lungs:  Normal respiratory effort, chest expands symmetrically. Lungs are clear to auscultation, no crackles or wheezes. Heart:  Normal rate and regular rhythm. S1 and S2 normal without gallop, murmur, click, rub or other extra sounds.  Radial pulses normal and equal. Extremities:  Mild pitting edema of LE bilaterally to pretib area  Tender over right plantar heel and somewhat over gastroc  tendon   Impression & Recommendations:  Problem # 1:  PERIPHERAL NEUROPATHY (ICD-356.9) restart Gabapentin. Orders: T-Vitamin B12 ([id number])  Problem # 2:  ESSENTIAL HYPERTENSION (ICD-401.9) Start HCTZ for this and #3 Her updated medication list for this problem includes:    Hydrochlorothiazide 25 Mg Tabs (Hydrochlorothiazide) .Marland Kitchen... 1/2 tab by mouth daily  Problem # 3:  EDEMA (ICD-782.3) Discussed elevation of legs. To check into compression stockings if meds and elevation do not help Her updated medication list for this problem includes:    Hydrochlorothiazide 25 Mg Tabs (Hydrochlorothiazide) .Marland Kitchen... 1/2 tab by mouth daily  Problem # 4:  FASCIITIS, PLANTAR (ICD-728.71)  Her updated medication list for this problem includes:    Naproxen 500 Mg Tabs (Naproxen) .Marland Kitchen... Take 1 tablet by mouth two times a day with food as needed for pain  Orders: Physical Therapy Referral (PT)--possibly onto Sports medicine fellowship clinic earlier rather than later.  Problem # 5:  ANXIETY (ICD-300.00) Discussed would not prescribe benzodiazepine for this. To consider counseling or SSRI if particularly bothersome, but sounds like really associated with test taking. Encouraged her not to use other's meds (despite her medical knowledge). Her updated medication list for this problem includes:    Trazodone Hcl 50 Mg Tabs (Trazodone hcl) .Marland Kitchen... Take 1 tab by mouth at bedtime as needed for sleep  Complete Medication List: 1)  Vitamin E 200 Unit Caps (Vitamin e) .... Once daily 2)  Tramadol Hcl 50 Mg Tabs (Tramadol hcl) .... Take 1 tablet by mouth three times a day as needed for pain 3)  Calcium 600 Mg Tabs (Calcium) .... Take 1 tablet by mouth two times a day 4)  Omeprazole 20 Mg Cpdr (Omeprazole) .... Take 1 tablet by mouth once a day 5)  Vitamin D 1000 Unit Tabs (Cholecalciferol) .... Take 1 tablet by mouth once a day 6)  Pravastatin Sodium 40 Mg Tabs (Pravastatin sodium) .... Take 1 tab by mouth at  bedtime for cholesterol 7)  Naproxen 500 Mg Tabs (Naproxen) .... Take 1 tablet by mouth two times a day with food as needed for pain 8)  Trazodone Hcl 50 Mg Tabs (Trazodone hcl) .... Take 1 tab by mouth at bedtime as needed for sleep 9)  Fish Oil 1000 Mg Caps (Omega-3 fatty acids) .... Take 1 capsule by mouth once a day 10)  Gabapentin 300 Mg Caps (Gabapentin) .Marland Kitchen.. 1 cap by mouth at bedtime for 3 nights, then increase to 2 caps at bedtime 11)  Hydrochlorothiazide 25 Mg Tabs (Hydrochlorothiazide) .... 1/2 tab by mouth daily  Patient Instructions: 1)  Nurse visit in 1 month for bp check and BMET--starting HCTZ at 12.5 mg today.  Bring in your blood pressure cuff to compare to ours  2)  Follow up with Dr. Delrae Alfred in 4 months --foot pain Prescriptions: HYDROCHLOROTHIAZIDE 25 MG TABS (HYDROCHLOROTHIAZIDE) 1/2 tab by mouth daily  #15 x 6   Entered and Authorized by:   Julieanne Manson MD   Signed by:   Julieanne Manson MD on 07/12/2010   Method used:   Faxed to ...       [facility] (retail)       [address] [place], [place]  [address]       Ph: [phone]       Fax: [phone]   RxID:   [id number] GABAPENTIN 300 MG CAPS (GABAPENTIN) 1 cap by mouth at bedtime for 3 nights, then increase to 2 caps at bedtime  #60 x 6   Entered and Authorized by:   Julieanne Manson MD   Signed by:   Julieanne Manson MD on 07/12/2010   Method used:   Print then Give to Patient   RxID:   [id number]    Orders Added: 1)  T-Vitamin B12 [34742-59563] 2)  Est. Patient Level IV [87564] 3)  Physical Therapy Referral [PT]

## 2010-08-01 NOTE — Letter (Signed)
Summary: *HSN Results Follow up  [facility]  [address] [place], [place] [address]   Phone: [phone]  Fax: [phone]      07/27/2010   Anna Esparza [address] [place], [place]  [address]   Dear  [name],                            ____S.Drinkard,FNP   ____D. Gore,FNP       ____B. McPherson,MD   ____V. Rankins,MD    __X__[name],MD    ____N. Daphine Deutscher, FNP  ____D. Reche Dixon, MD    ____K. Philipp Deputy, MD    ____Other     This letter is to inform you that your recent test(s):  _______Pap Smear    ___X__Lab Test     _______X-ray    ___X____ is within acceptable limits  _______ requires a medication change  _______ requires a follow-up lab visit  _______ requires a follow-up visit with your provider   Comments:  B12 level was fine--above 500       _________________________________________________________ If you have any questions, please contact our office                     Sincerely,  Julieanne Manson MD [facility]

## 2010-08-05 ENCOUNTER — Encounter: Payer: Self-pay | Admitting: Family Medicine

## 2010-08-05 ENCOUNTER — Ambulatory Visit (INDEPENDENT_AMBULATORY_CARE_PROVIDER_SITE_OTHER): Payer: Self-pay | Admitting: Family Medicine

## 2010-08-05 DIAGNOSIS — G609 Hereditary and idiopathic neuropathy, unspecified: Secondary | ICD-10-CM

## 2010-08-05 DIAGNOSIS — M722 Plantar fascial fibromatosis: Secondary | ICD-10-CM

## 2010-08-06 ENCOUNTER — Telehealth (INDEPENDENT_AMBULATORY_CARE_PROVIDER_SITE_OTHER): Payer: Self-pay | Admitting: Nurse Practitioner

## 2010-08-07 NOTE — Progress Notes (Signed)
Summary: PT referral  Phone Note Outgoing Call   Summary of Call: Nora--forgot to send this off to you at time of visit--needs PT referral--see if they would be willing to send to Sports medicine fellowship program early on if they feel that would be more beneficial to her. Initial call taken by: Julieanne Manson MD,  July 27, 2010 5:04 PM  Follow-up for Phone Call        Pt has an appt 08-02-10 @ 11 am Dr Jennette Kettle  Sports Medicine pt  husband aware of her appt  Follow-up by: Cheryll Dessert,  July 29, 2010 9:54 AM

## 2010-08-08 ENCOUNTER — Encounter: Payer: Self-pay | Admitting: Internal Medicine

## 2010-08-08 ENCOUNTER — Encounter (INDEPENDENT_AMBULATORY_CARE_PROVIDER_SITE_OTHER): Payer: Self-pay | Admitting: Internal Medicine

## 2010-08-08 LAB — CONVERTED CEMR LAB
BUN: 12 mg/dL (ref 6–23)
CO2: 24 meq/L (ref 19–32)
Calcium: 9.9 mg/dL (ref 8.4–10.5)
Chloride: 100 meq/L (ref 96–112)
Creatinine, Ser: 0.62 mg/dL (ref 0.40–1.20)
Glucose, Bld: 77 mg/dL (ref 70–99)

## 2010-08-13 NOTE — Assessment & Plan Note (Signed)
Summary: NP,FOOT PAIN,MC   Vital Signs:  Patient profile:   55 year old female Menstrual status:  postmenopausal Weight:      186.13 pounds Pulse rate:   89 / minute BP sitting:   142 / 90  (right arm)  Vitals Entered By: Rochele Pages RN (August 05, 2010 3:28 PM) CC: bilat foot burning, rt foot heel pain   Primary Care Provider:  Tereso Newcomer PA-C  CC:  bilat foot burning and rt foot heel pain.  History of Present Illness: B feet burning pain, going on 10 years. Has had a workup at her PCP and they started her on gabapentin--she was only able to titrate upto three hundred mg secondary to drowiness.  2) right foot plantar fascia pain--had it before in lefty and has had 2 injections a long time ago which resolved it. she also has some shoe inserts (dioes not have with her). pain in right is exactly same as she prev had in left.  Habits & Providers  Alcohol-Tobacco-Diet     Tobacco Status: quit  Current Medications (verified): 1)  Vitamin E 200 Unit Caps (Vitamin E) .... Once Daily 2)  Tramadol Hcl 50 Mg Tabs (Tramadol Hcl) .... Take 1 Tablet By Mouth Three Times A Day As Needed For Pain 3)  Calcium 600 Mg Tabs (Calcium) .... Take 1 Tablet By Mouth Two Times A Day 4)  Omeprazole 20 Mg Cpdr (Omeprazole) .... Take 1 Tablet By Mouth Once A Day 5)  Vitamin D 1000 Unit Tabs (Cholecalciferol) .... Take 1 Tablet By Mouth Once A Day 6)  Pravastatin Sodium 40 Mg Tabs (Pravastatin Sodium) .... Take 1 Tab By Mouth At Bedtime For Cholesterol 7)  Naproxen 500 Mg Tabs (Naproxen) .... Take 1 Tablet By Mouth Two Times A Day With Food As Needed For Pain 8)  Trazodone Hcl 50 Mg Tabs (Trazodone Hcl) .... Take 1 Tab By Mouth At Bedtime As Needed For Sleep 9)  Fish Oil 1000 Mg Caps (Omega-3 Fatty Acids) .... Take 1 Capsule By Mouth Once A Day 10)  Gabapentin 300 Mg Caps (Gabapentin) .Marland Kitchen.. 1 Cap By Mouth At Bedtime For 3 Nights, Then Increase To 2 Caps At Bedtime 11)  Hydrochlorothiazide 25 Mg Tabs  (Hydrochlorothiazide) .... 1/2 Tab By Mouth Daily 12)  Depakote 250 Mg Tbec (Divalproex Sodium) .... 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   Msk:  PES planus B. right plantar fascia ttp origin NEURO: feet intact sensation to soft touch, nor,mal proprioception Additional Exam:  Patient given informed consent for injection. Discussed possible complications of infection, bleeding or skin atrophy at site of injection. Possible side effect of avascular necrosis (focal area of bone death) due to steroid use.Appropriate verbal time out taken Are cleaned and prepped in usual sterile fashion. A --1-- cc kennalog plus -2---cc 1% lidocaine without epinephrine was injected into the---right  foot in area above plantar fascia using a lateral approach. Patient tolerated procedure well with no complications.    Impression & Recommendations:  Problem # 1:  PERIPHERAL NEUROPATHY (ICD-356.9) since she is unable to advance the gabapentin (and previously many years ago had some success with tegretol) we decided to stop gabapentin, start  depakote. rtc 4 w  Problem # 2:  FASCIITIS, PLANTAR (ICD-728.71)  injection today continue existing inserts and bring them to next visit if this is not resolved  Orders: Joint Aspirate / Injection, Intermediate (16109) Kenalog 10 mg inj (J3301)  Complete Medication List: 1)  Vitamin E 200 Unit Caps (Vitamin e) .... Once daily 2)  Tramadol Hcl 50 Mg Tabs (Tramadol hcl) .... Take 1 tablet by mouth three times a day as needed for pain 3)  Calcium 600 Mg Tabs (Calcium) .... Take 1 tablet by mouth two times a day 4)  Omeprazole 20  Mg Cpdr (Omeprazole) .... Take 1 tablet by mouth once a day 5)  Vitamin D 1000 Unit Tabs (Cholecalciferol) .... Take 1 tablet by mouth once a day 6)  Pravastatin Sodium 40 Mg Tabs (Pravastatin sodium) .... Take 1 tab by mouth at bedtime for cholesterol 7)  Naproxen 500 Mg Tabs (Naproxen) .... Take 1 tablet by mouth two times a day with food as needed for pain 8)  Trazodone Hcl 50 Mg Tabs (Trazodone hcl) .... Take 1 tab by mouth at bedtime as needed for sleep 9)  Fish Oil 1000 Mg Caps (Omega-3 fatty acids) .... Take 1 capsule by mouth once a day 10)  Gabapentin 300 Mg Caps (Gabapentin) .Marland Kitchen.. 1 cap by mouth at bedtime for 3 nights, then increase to 2 caps at bedtime 11)  Hydrochlorothiazide 25 Mg Tabs (Hydrochlorothiazide) .... 1/2 tab by mouth daily 12)  Depakote 250 Mg Tbec (Divalproex sodium) .... Take 1 tablet daily at bedtime  Patient Instructions: 1)  stop taking neurotin. 2)  Start taking depakote 250mg   1 tablet daily. 3)  return in 4 weeks for follow up appointment. Prescriptions: DEPAKOTE 250 MG TBEC (DIVALPROEX SODIUM) take 1 tablet daily at bedtime  #30 x 1   Entered by:   Ellin Mayhew MD   Authorized by:   Denny Levy MD   Signed by:   Ellin Mayhew MD on 08/05/2010   Method used:   Print then Give to Patient   RxID:   [id number]    Orders Added: 1)  Est. Patient Level IV [95621] 2)  Joint Aspirate / Injection, Intermediate [20605] 3)  Kenalog 10 mg inj [J3301]    Patient Instructions: 1)  stop taking neurotin. 2)  Start taking depakote 250mg   1 tablet daily. 3)  return in 4 weeks for follow up appointment.

## 2010-08-13 NOTE — Assessment & Plan Note (Signed)
Summary: BP recheck and BMET  Nurse Visit   Vital Signs:  Patient profile:   55 year old female Menstrual status:  postmenopausal Weight:      185.1 pounds Temp:     97.4 degrees F oral BP sitting:   130 / 84  (right arm) Cuff size:   regular  Vitals Entered By: Dutch Quint RN (August 08, 2010 3:23 PM)  Impression & Recommendations:  Problem # 1:  ESSENTIAL HYPERTENSION (ICD-401.9) BP better -- brought in personal BP cuff to compare, scant difference in readings Continue meds To schedule 4 month f/u with provider re foot pain and BP  Her updated medication list for this problem includes:    Hydrochlorothiazide 25 Mg Tabs (Hydrochlorothiazide) .Marland Kitchen... 1/2 tab by mouth daily  Orders: T-Basic Metabolic Panel [phone])  Complete Medication List: 1)  Vitamin E 200 Unit Caps (Vitamin e) .... Once daily 2)  Tramadol Hcl 50 Mg Tabs (Tramadol hcl) .... Take 1 tablet by mouth three times a day as needed for pain 3)  Calcium 600 Mg Tabs (Calcium) .... Take 1 tablet by mouth two times a day 4)  Omeprazole 20 Mg Cpdr (Omeprazole) .... Take 1 tablet by mouth once a day 5)  Vitamin D 1000 Unit Tabs (Cholecalciferol) .... Take 1 tablet by mouth once a day 6)  Pravastatin Sodium 40 Mg Tabs (Pravastatin sodium) .... Take 1 tab by mouth at bedtime for cholesterol 7)  Naproxen 500 Mg Tabs (Naproxen) .... Take 1 tablet by mouth two times a day with food as needed for pain 8)  Trazodone Hcl 50 Mg Tabs (Trazodone hcl) .... Take 1 tab by mouth at bedtime as needed for sleep 9)  Fish Oil 1000 Mg Caps (Omega-3 fatty acids) .... Take 1 capsule by mouth once a day 10)  Gabapentin 300 Mg Caps (Gabapentin) .Marland Kitchen.. 1 cap by mouth at bedtime for 3 nights, then increase to 2 caps at bedtime 11)  Hydrochlorothiazide 25 Mg Tabs (Hydrochlorothiazide) .... 1/2 tab by mouth daily 12)  Depakote 250 Mg Tbec (Divalproex sodium) .... Take 1 tablet daily at bedtime   Serial Vital Signs/Assessments:  Time       Position  BP       Pulse  Resp  Temp     By 3:38 PM             136/84                         Dutch Quint RN  Comments: 3:38 PM This BP was done on pt.'s personal cuff. By: Dutch Quint RN    Primary Care Provider:  Tereso Newcomer PA-C  CC:  BP recheck and BMET.  History of Present Illness: 07/12/10  BP 138/98 P90.  Was to start on HCTZ 25 mg. 1/2 tab daily and bring home BP monitor to compare.  Taking HCTZ as ordered, no missed doses.  Brought personal BP cuff.  Had injection hydroxycortisone in right foot 2 days ago.   Review of Systems CV:  Denies CP, SOB, dizziness, visual changes, headache.  Does have peripheral edema BLE.Marland Kitchen   Physical Exam  General:  alert, well-developed, well-nourished, well-hydrated, and overweight-appearing.   Extremities:  trace to 1+ pitting edema to BLE.   Patient Instructions: 1)  Reviewed with Dr. Delrae Alfred 2)  Your blood pressure is better - thank you for bringing your personal BP cuff in today. 3)  Continue medications as ordered- call [facility] pharmacy  for refills before you run out. 4)  We will let you know the results of your labwork. 5)  Call to make follow-up appointment for four months with provider  regarding foot pain and blood pressure. 6)  Call if anything changes or if you have any questions.  CC: BP recheck and BMET Is Patient Diabetic? No  Does patient need assistance? Functional Status Self care Ambulation Normal   Allergies: No Known Drug Allergies  Orders Added: 1)  Est. Patient Level I [04540] 2)  T-Basic Metabolic Panel [[id number]]

## 2010-08-13 NOTE — Progress Notes (Signed)
  Phone Note From Pharmacy   Caller: Mabeline Caras Summary of Call: Per [place] -- Mabeline Caras from [facility] pharmacy called -- Wants to know if Dr. Delrae Alfred will "OK" pt. to get Depakote 250mg  that was prescribed at [facility] -- Dr. Delrae Alfred said it is "OK". -- Hale Drone CMA  August 06, 2010 9:38 AM   Follow-up for Phone Call        spoke with Ria Comment and let her know what via mulberry Follow-up by: [place] Shannon,  August 06, 2010 10:07 AM

## 2010-08-18 ENCOUNTER — Encounter: Payer: Self-pay | Admitting: *Deleted

## 2010-08-29 ENCOUNTER — Encounter (INDEPENDENT_AMBULATORY_CARE_PROVIDER_SITE_OTHER): Payer: Self-pay | Admitting: Internal Medicine

## 2010-09-02 ENCOUNTER — Ambulatory Visit: Payer: Self-pay | Admitting: Family Medicine

## 2010-09-03 NOTE — Letter (Signed)
Summary: *HSN Results Follow up  [facility]  [address] [place], [place] [address]   Phone: [phone]  Fax: [phone]      08/29/2010   Anna Esparza [address] [place], [place]  [address]  [place]   Dear  Ms. Anna Esparza,                            ____S.Drinkard,FNP   ____D. Gore,FNP       ____B. McPherson,MD   ____V. Rankins,MD    ___X_E. Lynnox Girten,MD    ____N. Daphine Deutscher, FNP  ____D. Reche Dixon, MD    ____K. Philipp Deputy, MD    ____Other     This letter is to inform you that your recent test(s):  _______Pap Smear    ___X____Lab Test     _______X-ray    ____X___ is within acceptable limits  _______ requires a medication change  _______ requires a follow-up lab visit  _______ requires a follow-up visit with your provider   Comments:  Basic metabolic profile was normal--results enclosed.       _________________________________________________________ If you have any questions, please contact our office                     Sincerely,  Julieanne Manson MD [facility]

## 2010-09-26 LAB — POCT CARDIAC MARKERS
CKMB, poc: <1 ng/mL — ABNORMAL LOW (ref 1.0–8.0)
Myoglobin, poc: 38.8 ng/mL (ref 12–200)
Troponin i, poc: <0.05 ng/mL (ref 0.00–0.09)

## 2010-09-26 LAB — COMPREHENSIVE METABOLIC PANEL
ALT: 14 U/L (ref 0–35)
AST: 18 U/L (ref 0–37)
Alkaline Phosphatase: 43 U/L (ref 39–117)
CO2: 30 mEq/L (ref 19–32)
Chloride: 107 mEq/L (ref 96–112)
GFR calc Af Amer: >60 mL/min (ref >60)
GFR calc non Af Amer: >60 mL/min (ref >60)
Glucose, Bld: 94 mg/dL (ref 70–99)
Sodium: 140 mEq/L (ref 135–145)
Total Bilirubin: 0.8 mg/dL (ref 0.3–1.2)

## 2010-09-26 LAB — DIFFERENTIAL
Eosinophils Absolute: 0.1 10*3/uL (ref 0.0–0.7)
Lymphocytes Relative: 53 % — ABNORMAL HIGH (ref 12–46)
Lymphs Abs: 2.5 10*3/uL (ref 0.7–4.0)
Monocytes Relative: 8 % (ref 3–12)
Neutrophils Relative %: 36 % — ABNORMAL LOW (ref 43–77)

## 2010-09-26 LAB — URINALYSIS, ROUTINE W REFLEX MICROSCOPIC
Bilirubin Urine: NEGATIVE
Ketones, ur: NEGATIVE mg/dL
Nitrite: NEGATIVE
Specific Gravity, Urine: 1.017 (ref 1.005–1.030)
Urobilinogen, UA: 0.2 mg/dL (ref 0.0–1.0)

## 2010-09-26 LAB — D-DIMER, QUANTITATIVE: D-Dimer, Quant: <0.22 ug/mL-FEU (ref 0.00–0.48)

## 2010-09-26 LAB — CBC
Hemoglobin: 12 g/dL (ref 12.0–15.0)
MCHC: 34.7 g/dL (ref 30.0–36.0)
MCV: 87.6 fL (ref 78.0–100.0)
MCV: 88.1 fL (ref 78.0–100.0)
RBC: 3.91 MIL/uL (ref 3.87–5.11)
RDW: 12.8 % (ref 11.5–15.5)
WBC: 4.9 10*3/uL (ref 4.0–10.5)

## 2010-09-26 LAB — BASIC METABOLIC PANEL
BUN: 11 mg/dL (ref 6–23)
CO2: 27 mEq/L (ref 19–32)
Calcium: 9.3 mg/dL (ref 8.4–10.5)
Chloride: 108 mEq/L (ref 96–112)
Creatinine, Ser: 0.66 mg/dL (ref 0.4–1.2)
Glucose, Bld: 97 mg/dL (ref 70–99)

## 2010-09-26 LAB — CARDIAC PANEL(CRET KIN+CKTOT+MB+TROPI)
CK, MB: 0.6 ng/mL (ref 0.3–4.0)
Relative Index: INVALID (ref 0.0–2.5)
Total CK: 26 U/L (ref 7–177)
Troponin I: <0.01 ng/mL (ref 0.00–0.06)

## 2010-09-26 LAB — URINE MICROSCOPIC-ADD ON

## 2010-09-26 LAB — LIPID PANEL
HDL: 40 mg/dL (ref >39)
Total CHOL/HDL Ratio: 5.5 RATIO

## 2010-09-26 LAB — HEPATIC FUNCTION PANEL
ALT: 15 U/L (ref 0–35)
AST: 18 U/L (ref 0–37)
Alkaline Phosphatase: 47 U/L (ref 39–117)
Bilirubin, Direct: 0.1 mg/dL (ref 0.0–0.3)
Indirect Bilirubin: 0.5 mg/dL (ref 0.3–0.9)
Total Bilirubin: 0.6 mg/dL (ref 0.3–1.2)

## 2010-09-26 LAB — URINE CULTURE
Colony Count: NO GROWTH
Special Requests: NEGATIVE

## 2010-09-26 LAB — TROPONIN I: Troponin I: 0.01 ng/mL (ref 0.00–0.06)

## 2010-09-26 LAB — CK TOTAL AND CKMB (NOT AT ARMC): CK, MB: 0.7 ng/mL (ref 0.3–4.0)

## 2010-09-26 LAB — PROTIME-INR: Prothrombin Time: 12.6 seconds (ref 11.6–15.2)

## 2010-09-26 LAB — TSH: TSH: 2.218 u[IU]/mL (ref 0.350–4.500)

## 2010-09-26 LAB — VITAMIN B12: Vitamin B-12: 658 pg/mL (ref 211–911)

## 2010-10-21 ENCOUNTER — Ambulatory Visit: Payer: Self-pay | Admitting: Family Medicine

## 2010-10-29 NOTE — Discharge Summary (Signed)
NAME:  AL NOUD, Anna Esparza                ACCOUNT NO.:  [id number]   MEDICAL RECORD NO.:  [id number]          PATIENT TYPE:  OBV   LOCATION:  4707                         FACILITY:  [facility]   PHYSICIAN:  Lonia Blood, M.D.DATE OF BIRTH:  11-08-1955   DATE OF ADMISSION:  09/05/2008  DATE OF DISCHARGE:  09/06/2008                               DISCHARGE SUMMARY   PRIMARY CARE PHYSICIAN:  Maryelizabeth Rowan, M.D.   DISCHARGE DIAGNOSES:  1. Atypical chest pain.      a.     Negative cardiac enzymes x3 full sets.      b.     Negative electrocardiogram.      c.     Echocardiogram results pending at the time of discharge.      d.     Outpatient risk stratification recommended.  2. Grade 2/6 systolic murmur, previously undiagnosed - echocardiogram      pending at the time of discharge, hemodynamically stable.  3. History of anxiety disorder.  4. Depression.  5. Idiopathic peripheral neuropathy.  6. History of left foot cellulitis, associated with tendinitis.  7. Osteopenia.  8. History of vitamin D deficiency.  9. Recent urinary tract infection, completed course of Cipro.   DISCHARGE MEDICATIONS:  1. Valium 2.5 mg to 5 mg p.o. p.r.n. anxiety daily.  2. Neurontin 200 mg p.o. q.h.s.  3. Fosamax 70 mg p.o. every Thursday.  4. Aspirin 81 mg p.o. daily.  5. Ibuprofen 200 or 400 mg every 8 to 12 hours p.r.n. pain.  6. Nitrostat 0.4 mg one sublingual q.5 minutes x3 total p.r.n. chest      pain - Call physician if use required.   FOLLOW UP:  The patient is advised to follow up with Dr. Duanne Guess in 5-7  days for recheck.  At that time, echocardiogram results to investigate  the patient's murmur should be available and should be reviewed by Dr.  Duanne Guess.  Furthermore, outpatient risk stratification for coronary disease  should be considered with arrangement of outpatient stress test of the  physician's choosing.   PROCEDURE:  Transthoracic echocardiogram 09/06/2008, results pending.   HISTORY OF  PRESENT ILLNESS:  Ms. Anna Esparza is a very pleasant 55-year-  old female whose cardiac risk factors including tobacco abuse.  She is  confirmed to be nondiabetic this evaluation.  She was admitted to the  hospital with complaints of substernal and left sided chest pain.  This  was described at times as an achiness and other times as a heaviness.  It had been present for greater than a week and was reported as being  constant for a minimum of 24 hours at the time of her presentation.  The  patient reported that her symptoms seemed to come on at times of  significant emotional distress.  Symptoms at home seemed to respond to  baby aspirin and valium.  The patient was admitted to the acute unit.   EKG was nonacute during the entire hospital stay with no evidence of ST  or T wave elevation of depression. Cardiac enzymes were cycled and were  entirely negative.  D-dimer was assessed and was normal.  BNP and TSH  were also assessed and were normal.  B12 level and hemoglobin A1c were  both found to be entirely normal.  Fasting lipid panel did reveal an  elevate LDL at 136 with an HDL of 40.  A low fat diet is recommended.  This should be followed up in 6 to 8 weeks time to determine if further  measures are indicated.  Otherwise, the patient remained entirely  asymptomatic throughout her hospital stay.  In that the patient had  ruled out for acute MI and her symptoms had resolved, and given her  limited risk factor profile, she was felt to be appropriate for  outpatient risk stratification.  She was asymptomatic and vital signs  were stable. She was therefore cleared for discharge. Echocardiogram was  obtained given the fact that a 2/6 systolic murmur was appreciated at  the time of admission.  The results are still pending at the present  time.  Due to the fact that the patient is entirely hemodynamically  stable, however, it is not felt that she  is likely suffering from a  valvular disorder  that would require in patient hospital treatment and  she is therefore cleared for discharge home.      Lonia Blood, M.D.  Electronically Signed     Lonia Blood, M.D.  Electronically Signed    JTM/MEDQ  D:  09/06/2008  T:  09/06/2008  Job:  [id number]   cc:   Maryelizabeth Rowan, M.D.

## 2010-10-29 NOTE — H&P (Signed)
NAME:  Anna Esparza, Anna Esparza                ACCOUNT NO.:  [id number]   MEDICAL RECORD NO.:  [id number]          PATIENT TYPE:  OBV   LOCATION:  1833                         FACILITY:  [facility]   PHYSICIAN:  Elliot Cousin, M.D.    DATE OF BIRTH:  June 06, 1956   DATE OF ADMISSION:  09/05/2008  DATE OF DISCHARGE:                              HISTORY & PHYSICAL   PRIMARY CARE PHYSICIAN:  Maryelizabeth Rowan, MD.   CHIEF COMPLAINT:  Chest pain.   HISTORY OF PRESENT ILLNESS:  The patient is a 55 year old woman with a  past medical history significant for anxiety, depression, and peripheral  neuropathy.  She presents to the emergency department with a chief  complaint of chest pain.  She describes the pain as a heaviness,  although she says that it was an achiness when it first started 1 week  ago.  Her symptoms started approximately 1 week ago and had been  intermittent.  However, over the past 24 hours her pain has been  constant and accompanied by shortness of breath and palpitations.  The  pain usually comes with an increase in stress, particularly when she has  stress-related issues with her children.  The pain and shortness of  breath also worsen with activity.  When the pain occurs, she takes a  Baby Aspirin and a half of a 5 mg Valium tablet.  Generally after these  measures, the pain and shortness of breath resolve.  She rates the pain  as moderate on a scale from mild to severe.  The pain does radiate to  the back at times.  The pain is located on the left and sometimes  substernally.  It is not associated with diaphoresis or cough or  pleurisy.  It is associated with nausea, but no vomiting  The pain  generally lasts from a few seconds to a few minutes; however, this  morning it lasted several hours which prompted her visit to the  emergency department today.  Her review of systems is negative for  recent travel, swelling in her legs, fever, chills, vomiting, abdominal  pain, and painful  urination.  Her review of systems is positive for a  mild productive cough with clear sputum, shortness of breath, anxiety  symptoms, occasional heartburn, and burning and tingling in her lower  extremities.   During the evaluation in the emergency department, the patient is noted  to be afebrile and hemodynamically stable.  Her electrocardiogram  reveals normal sinus rhythm with a heart rate of 79 beats per minute and  relatively poor R-wave progression.  There are no ST or T-wave  abnormalities.  Her initial cardiac markers are negative.  Her D-dimer  is less than 0.22.  Her chest x-ray reveals no acute disease.  She will  be admitted for 24-hour observation for further evaluation.   PAST MEDICAL HISTORY:  1. Anxiety with depression.  2. Peripheral neuropathy.  3. History of left foot cellulitis associated with tendinitis, status      post Depo-Medrol injection by her orthopedic physician.  4. Osteopenia.  5. History of Vitamin D deficiency.  6. Allergy to CATS.  7. Recent treatment for an urinary tract infection with Cipro.   MEDICATIONS:  1. Valium 5 mg 1/2 tablet to 1 tablet daily as needed.  2. Gabapentin 100 mg 2 capsules nightly.  3. Fosamax 70 mg once weekly.  4. Aspirin 81 mg daily as needed.  5. Ibuprofen 200 mg to 400 mg every 8-12 hours as needed.  6. Completed Cipro course.  7. Effexor and Vitamin D were both  discontinued in the recent past.   ALLERGIES:  No known drug allergies.   SOCIAL HISTORY:  The patient is married.  She has 3 children.  [place]  She moved from [place] in August 2009.  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  She is currently in  college at [employer] to learn [place].  She quit smoking 6 years ago.  She  drinks whiskey on occasion, approximately once or twice weekly.   FAMILY HISTORY:  Her mother is 48 years of age and has arthritis and  osteoporosis.  Her father died of sudden death at 67 years of age,   etiology unclear.   REVIEW OF SYSTEMS:  As above.   EXAM:  Temperature 97.8, blood pressure 118/74, pulse 60, respiratory  rate 18, oxygen saturation 98% on room air.  GENERAL:  The patient is a pleasant overweight 55 year old [place] woman  who is currently lying in bed in no acute distress.  HEENT:  Head is normocephalic, nontraumatic.  Pupils equal, round and  reactive to light, extraocular movements are intact, the conjunctivae  are clear, sclerae are white, tympanic membranes are clear bilaterally.  Oropharynx reveals good dentition.  Mucous membranes are moist.  No  posterior exudates or erythema.  NECK:  Supple.  No adenopathy, no thyromegaly, no bruit, no JVD.  LUNGS:  Clear to auscultation bilaterally.  HEART:  S1-S2 with a 2-3/6 systolic murmur.  ABDOMEN:  Mildly obese, positive bowel sounds, soft, nontender,  nondistended, no hepatosplenomegaly, no masses palpated.  CHEST WALL:  There is no tenderness, erythema or edema of the chest wall  bilaterally.  MUSCULOSKELETAL:  There is no tenderness between the patient's scapula  upon palpation.  GU/RECTAL:  Deferred.  EXTREMITIES:  Pedal pulses are palpable bilaterally.  Non-pitting mild  edema pedally.  Pedal pulses are palpable bilaterally.  NEUROLOGIC:  The patient is alert and oriented x3.  Cranial nerves II-  XII are intact.  Strength is 5/5 throughout.  Sensation is intact.  PSYCHOLOGICAL:  The patient is alert and oriented.  Her speech is clear  and not pressured.  Her affect is pleasant.  She denies active  anxiousness or panic symptoms.   ADMISSION LABORATORIES:  Electrocardiogram and chest x-ray results are  above.  D-dimer less than 0.22, BNP less than 30.  Sodium 140, potassium  4.2, chloride 108, CO2 27, glucose 97.  BUN 11, creatinine 0.66, calcium  9.3.  CK-MB less than 1, troponin I less than 0.05, myoglobin 38.8.  WBC  4.8, hemoglobin 13.0, platelets 249.   ASSESSMENT:  1. Chest pain.  The patient's chest  pain appears to be somewhat      atypical.  She does not appear to have many risk factors for      coronary artery disease.  The only obvious risk factor is age and      perhaps her history of tobacco abuse.  She is currently chest pain-      free.  Of note, nitroglycerin  paste was placed by the emergency      department physician prior to the patient's complete resolution of      chest pain.  Her electrocardiogram reveals no ST or T-wave      abnormalities; however, there is a relative poor R-wave      progression.  On exam, she does have a 2/6 systolic murmur.  She is      currently chest pain-free.  2. Peripheral neuropathy.  The patient takes gabapentin chronically.      She says that Dr. Duanne Guess has evaluated her neuropathy in the office.      There has been no specific explanation for why she has neuropathic      symptoms.  3. Recent urinary tract infection.  The patient was treated with      Cipro.  She has no complaints of dysuria now.  4. Anxiety with depression.  The patient had been treated with Effexor      in the past.  She is being treated with diazepam as needed.  She      denies depression and anxiety currently, although she expresses      there is a great deal of stress at home.   PLAN:  1. For further evaluation will check cardiac enzymes, TSH, fasting      lipid panel, and a hemoglobin A1c.  Will order a 2-D echocardiogram      to assess for mitral valve prolapse and wall motion abnormalities.  2. If the patient's cardiac enzymes are negative and if her 2-D      echocardiogram is unrevealing, she could safely be discharged to      home for further outpatient evaluation.  3. Will discontinue the nitro paste and start as-needed sublingual      nitroglycerin.  Will also start prophylactic and empiric Protonix      and subcu heparin.  4. Will avoid all NSAIDs with the exception of a daily aspirin at 81      mg.  Would discontinue aspirin if her cardiac enzymes are  negative.  5. Will recheck a urinalysis and culture.  Will also assess her      Vitamin B12 level to rule out deficiency as a potential cause of      her neuropathy.  6. Will continue as-needed Valium for anxiety.      Elliot Cousin, M.D.  Electronically Signed     DF/MEDQ  D:  09/05/2008  T:  09/05/2008  Job:  [id number]   cc:   Maryelizabeth Rowan, M.D.

## 2011-01-30 ENCOUNTER — Ambulatory Visit (HOSPITAL_COMMUNITY)
Admission: RE | Admit: 2011-01-30 | Discharge: 2011-01-30 | Disposition: A | Discharge status: Home / Self Care | Payer: Self-pay | Source: Ambulatory Visit | Attending: Cardiology | Admitting: Cardiology

## 2011-01-30 DIAGNOSIS — R072 Precordial pain: Secondary | ICD-10-CM | POA: Insufficient documentation

## 2011-01-30 DIAGNOSIS — I10 Essential (primary) hypertension: Secondary | ICD-10-CM | POA: Insufficient documentation

## 2011-06-16 ENCOUNTER — Other Ambulatory Visit (HOSPITAL_COMMUNITY): Payer: Self-pay | Admitting: Physician Assistant

## 2011-06-16 DIAGNOSIS — Z1231 Encounter for screening mammogram for malignant neoplasm of breast: Secondary | ICD-10-CM

## 2011-07-17 ENCOUNTER — Ambulatory Visit (HOSPITAL_COMMUNITY)
Admission: RE | Admit: 2011-07-17 | Discharge: 2011-07-17 | Disposition: A | Discharge status: Home / Self Care | Payer: Self-pay | Source: Ambulatory Visit | Attending: Physician Assistant | Admitting: Physician Assistant

## 2011-07-17 DIAGNOSIS — Z1231 Encounter for screening mammogram for malignant neoplasm of breast: Secondary | ICD-10-CM | POA: Insufficient documentation

## 2012-06-17 ENCOUNTER — Other Ambulatory Visit (HOSPITAL_COMMUNITY): Payer: Self-pay | Admitting: Family Medicine

## 2012-06-17 DIAGNOSIS — Z1231 Encounter for screening mammogram for malignant neoplasm of breast: Secondary | ICD-10-CM

## 2012-07-19 ENCOUNTER — Ambulatory Visit (HOSPITAL_COMMUNITY): Payer: No Typology Code available for payment source

## 2012-08-05 ENCOUNTER — Ambulatory Visit (HOSPITAL_COMMUNITY): Payer: No Typology Code available for payment source

## 2012-10-04 ENCOUNTER — Ambulatory Visit (HOSPITAL_COMMUNITY): Payer: No Typology Code available for payment source

## 2012-10-12 ENCOUNTER — Ambulatory Visit (HOSPITAL_COMMUNITY): Payer: No Typology Code available for payment source

## 2012-10-15 ENCOUNTER — Ambulatory Visit (HOSPITAL_COMMUNITY)
Admission: RE | Admit: 2012-10-15 | Discharge: 2012-10-15 | Disposition: A | Discharge status: Home / Self Care | Payer: No Typology Code available for payment source | Source: Ambulatory Visit | Attending: Family Medicine | Admitting: Family Medicine

## 2012-10-15 DIAGNOSIS — Z1231 Encounter for screening mammogram for malignant neoplasm of breast: Secondary | ICD-10-CM | POA: Insufficient documentation

## 2013-07-21 ENCOUNTER — Encounter: Payer: Self-pay | Admitting: General Surgery

## 2013-09-30 ENCOUNTER — Other Ambulatory Visit (HOSPITAL_BASED_OUTPATIENT_CLINIC_OR_DEPARTMENT_OTHER): Payer: Self-pay | Admitting: Family Medicine

## 2013-09-30 DIAGNOSIS — Z1231 Encounter for screening mammogram for malignant neoplasm of breast: Secondary | ICD-10-CM

## 2013-10-21 ENCOUNTER — Ambulatory Visit (HOSPITAL_BASED_OUTPATIENT_CLINIC_OR_DEPARTMENT_OTHER): Payer: No Typology Code available for payment source

## 2013-10-28 ENCOUNTER — Ambulatory Visit (HOSPITAL_BASED_OUTPATIENT_CLINIC_OR_DEPARTMENT_OTHER): Payer: No Typology Code available for payment source

## 2013-10-31 ENCOUNTER — Ambulatory Visit (HOSPITAL_BASED_OUTPATIENT_CLINIC_OR_DEPARTMENT_OTHER)
Admission: RE | Admit: 2013-10-31 | Discharge: 2013-10-31 | Disposition: A | Discharge status: Home / Self Care | Payer: Medicaid Other | Source: Ambulatory Visit | Attending: Family Medicine | Admitting: Family Medicine

## 2013-10-31 DIAGNOSIS — R928 Other abnormal and inconclusive findings on diagnostic imaging of breast: Secondary | ICD-10-CM | POA: Insufficient documentation

## 2013-10-31 DIAGNOSIS — Z1231 Encounter for screening mammogram for malignant neoplasm of breast: Secondary | ICD-10-CM

## 2013-11-01 ENCOUNTER — Other Ambulatory Visit: Payer: Self-pay | Admitting: Family Medicine

## 2013-11-01 DIAGNOSIS — R928 Other abnormal and inconclusive findings on diagnostic imaging of breast: Secondary | ICD-10-CM

## 2013-11-22 ENCOUNTER — Other Ambulatory Visit: Payer: Self-pay | Admitting: Family Medicine

## 2013-11-22 ENCOUNTER — Ambulatory Visit
Admission: RE | Admit: 2013-11-22 | Discharge: 2013-11-22 | Disposition: A | Discharge status: Home / Self Care | Payer: Medicaid Other | Source: Ambulatory Visit | Attending: Family Medicine | Admitting: Family Medicine

## 2013-11-22 ENCOUNTER — Encounter (INDEPENDENT_AMBULATORY_CARE_PROVIDER_SITE_OTHER): Payer: Self-pay

## 2013-11-22 DIAGNOSIS — R928 Other abnormal and inconclusive findings on diagnostic imaging of breast: Secondary | ICD-10-CM

## 2013-11-22 DIAGNOSIS — N6320 Unspecified lump in the left breast, unspecified quadrant: Secondary | ICD-10-CM

## 2013-11-28 ENCOUNTER — Other Ambulatory Visit: Payer: Self-pay | Admitting: Family Medicine

## 2013-11-28 ENCOUNTER — Ambulatory Visit
Admission: RE | Admit: 2013-11-28 | Discharge: 2013-11-28 | Disposition: A | Discharge status: Home / Self Care | Payer: Medicaid Other | Source: Ambulatory Visit | Attending: Family Medicine | Admitting: Family Medicine

## 2013-11-28 ENCOUNTER — Other Ambulatory Visit (HOSPITAL_COMMUNITY): Payer: Self-pay | Admitting: Diagnostic Radiology

## 2013-11-28 DIAGNOSIS — N6320 Unspecified lump in the left breast, unspecified quadrant: Secondary | ICD-10-CM

## 2013-11-29 ENCOUNTER — Ambulatory Visit
Admission: RE | Admit: 2013-11-29 | Discharge: 2013-11-29 | Disposition: A | Discharge status: Home / Self Care | Payer: Medicaid Other | Source: Ambulatory Visit | Attending: Family Medicine | Admitting: Family Medicine

## 2013-11-29 DIAGNOSIS — N6320 Unspecified lump in the left breast, unspecified quadrant: Secondary | ICD-10-CM
